# Patient Record
Sex: Female | Born: 1975
Health system: Southern US, Community
[De-identification: ages and names within clinical notes are randomized; demographics above are authoritative.]

## PROBLEM LIST (undated history)

## (undated) DIAGNOSIS — N760 Acute vaginitis: Secondary | ICD-10-CM

## (undated) DIAGNOSIS — R1031 Right lower quadrant pain: Secondary | ICD-10-CM

## (undated) DIAGNOSIS — K589 Irritable bowel syndrome without diarrhea: Secondary | ICD-10-CM

## (undated) DIAGNOSIS — N83209 Unspecified ovarian cyst, unspecified side: Secondary | ICD-10-CM

## (undated) DIAGNOSIS — N898 Other specified noninflammatory disorders of vagina: Secondary | ICD-10-CM

## (undated) DIAGNOSIS — E559 Vitamin D deficiency, unspecified: Secondary | ICD-10-CM

## (undated) DIAGNOSIS — I10 Essential (primary) hypertension: Secondary | ICD-10-CM

## (undated) DIAGNOSIS — B9689 Other specified bacterial agents as the cause of diseases classified elsewhere: Secondary | ICD-10-CM

## (undated) HISTORY — PX: WISDOM TOOTH EXTRACTION: SHX21

## (undated) HISTORY — DX: Irritable bowel syndrome, unspecified: K58.9

## (undated) HISTORY — DX: Right lower quadrant pain: R10.31

## (undated) HISTORY — DX: Other specified bacterial agents as the cause of diseases classified elsewhere: B96.89

## (undated) HISTORY — DX: Vitamin D deficiency, unspecified: E55.9

## (undated) HISTORY — DX: Essential (primary) hypertension: I10

## (undated) HISTORY — DX: Acute vaginitis: N76.0

## (undated) HISTORY — DX: Other specified noninflammatory disorders of vagina: N89.8

## (undated) HISTORY — DX: Unspecified ovarian cyst, unspecified side: N83.209

---

## 2000-06-30 ENCOUNTER — Inpatient Hospital Stay (HOSPITAL_COMMUNITY): Admission: AD | Admit: 2000-06-30 | Discharge: 2000-07-03 | Payer: Self-pay | Admitting: Obstetrics and Gynecology

## 2001-01-22 ENCOUNTER — Other Ambulatory Visit: Admission: RE | Admit: 2001-01-22 | Discharge: 2001-01-22 | Payer: Self-pay | Admitting: Obstetrics and Gynecology

## 2002-11-11 ENCOUNTER — Emergency Department (HOSPITAL_COMMUNITY): Admission: EM | Admit: 2002-11-11 | Discharge: 2002-11-11 | Payer: Self-pay | Admitting: Emergency Medicine

## 2002-11-12 ENCOUNTER — Observation Stay (HOSPITAL_COMMUNITY): Admission: AD | Admit: 2002-11-12 | Discharge: 2002-11-13 | Payer: Self-pay | Admitting: Obstetrics & Gynecology

## 2002-11-12 ENCOUNTER — Encounter: Payer: Self-pay | Admitting: Obstetrics & Gynecology

## 2006-02-05 ENCOUNTER — Ambulatory Visit: Payer: Self-pay | Admitting: Family Medicine

## 2006-02-18 ENCOUNTER — Encounter: Payer: Self-pay | Admitting: Family Medicine

## 2006-02-18 DIAGNOSIS — M545 Low back pain: Secondary | ICD-10-CM

## 2007-10-20 ENCOUNTER — Other Ambulatory Visit: Admission: RE | Admit: 2007-10-20 | Discharge: 2007-10-20 | Payer: Self-pay | Admitting: Obstetrics and Gynecology

## 2007-10-23 ENCOUNTER — Ambulatory Visit (HOSPITAL_COMMUNITY): Admission: RE | Admit: 2007-10-23 | Discharge: 2007-10-23 | Payer: Self-pay | Admitting: Obstetrics & Gynecology

## 2008-03-26 HISTORY — PX: FLEXIBLE SIGMOIDOSCOPY: SHX1649

## 2008-10-11 ENCOUNTER — Emergency Department (HOSPITAL_COMMUNITY): Admission: EM | Admit: 2008-10-11 | Discharge: 2008-10-11 | Payer: Self-pay | Admitting: Emergency Medicine

## 2009-06-13 ENCOUNTER — Encounter: Payer: Self-pay | Admitting: Gastroenterology

## 2009-06-13 ENCOUNTER — Telehealth: Payer: Self-pay | Admitting: Gastroenterology

## 2009-06-13 DIAGNOSIS — R197 Diarrhea, unspecified: Secondary | ICD-10-CM

## 2009-06-15 ENCOUNTER — Encounter: Payer: Self-pay | Admitting: Gastroenterology

## 2009-06-16 LAB — CONVERTED CEMR LAB
ALT: 13 units/L (ref 0–35)
Alkaline Phosphatase: 60 units/L (ref 39–117)
HCT: 37.8 % (ref 36.0–46.0)
Lymphs Abs: 1.5 10*3/uL (ref 0.7–4.0)
MCV: 91.6 fL (ref 78.0–100.0)
Monocytes Absolute: 0.4 10*3/uL (ref 0.1–1.0)
Neutro Abs: 3 10*3/uL (ref 1.7–7.7)
Neutrophils Relative %: 60 % (ref 43–77)
Platelets: 215 10*3/uL (ref 150–400)
RBC: 4.13 M/uL (ref 3.87–5.11)
Total Protein: 7 g/dL (ref 6.0–8.3)
WBC: 5 10*3/uL (ref 4.0–10.5)

## 2009-06-22 ENCOUNTER — Encounter: Payer: Self-pay | Admitting: Gastroenterology

## 2009-07-04 ENCOUNTER — Ambulatory Visit (HOSPITAL_COMMUNITY): Admission: RE | Admit: 2009-07-04 | Discharge: 2009-07-04 | Payer: Self-pay | Admitting: Gastroenterology

## 2009-07-04 ENCOUNTER — Ambulatory Visit: Payer: Self-pay | Admitting: Gastroenterology

## 2009-09-15 ENCOUNTER — Other Ambulatory Visit: Admission: RE | Admit: 2009-09-15 | Discharge: 2009-09-15 | Payer: Self-pay | Admitting: Obstetrics and Gynecology

## 2010-04-25 NOTE — Letter (Signed)
Summary: EGD/FLEX SIG ORDER  EGD/FLEX SIG ORDER   Imported By: Ave Filter 06/22/2009 08:59:19  _____________________________________________________________________  External Attachment:    Type:   Image     Comment:   External Document

## 2010-04-25 NOTE — Progress Notes (Signed)
Summary: DIARRHEA  spoke with pt. having diarrhea for 2-3 mos.Abd pain getting worse. has pain after eating every time. lower abd pain. no fever or chills. no rectal bleeding. bm:1/day. Decreased appetite. no weight loss. no formed stool ever. doesn't feel stressed.   needs fecal lactoferrin, cbc, hfp, cdiff toxin x3, giardia ag this week. If all negative, needs EGD/? TCS. [place] MD  June 13, 2009 4:43 PM  Appended Document: DIARRHEA Orders faxed to the lab.

## 2010-04-25 NOTE — Miscellaneous (Signed)
Summary: Orders Update  Clinical Lists Changes  Problems: Added new problem of DIARRHEA (ICD-787.91) Orders: Added new Test order of T-CBC w/Diff [phone]) - Signed Added new Test order of T-Hepatic Function [phone]) - Signed Added new Test order of T-Stool Giardia / Crypto- EIA ([id number]) - Signed Added new Test order of T-Fecal WBC ([id number]) - Signed Added new Test order of T-Culture, C-Diff Toxin A/B ([id number]) - Signed Added new Test order of T-Culture, C-Diff Toxin A/B ([id number]) - Signed Added new Test order of T-Culture, C-Diff Toxin A/B ([id number]) - Signed

## 2010-09-20 ENCOUNTER — Other Ambulatory Visit (HOSPITAL_COMMUNITY)
Admission: RE | Admit: 2010-09-20 | Discharge: 2010-09-20 | Disposition: A | Discharge status: Home / Self Care | Payer: 59 | Source: Ambulatory Visit | Attending: Obstetrics and Gynecology | Admitting: Obstetrics and Gynecology

## 2010-09-20 ENCOUNTER — Other Ambulatory Visit: Payer: Self-pay | Admitting: Adult Health

## 2010-09-20 DIAGNOSIS — Z01419 Encounter for gynecological examination (general) (routine) without abnormal findings: Secondary | ICD-10-CM | POA: Insufficient documentation

## 2011-10-01 ENCOUNTER — Other Ambulatory Visit (HOSPITAL_COMMUNITY)
Admission: RE | Admit: 2011-10-01 | Discharge: 2011-10-01 | Disposition: A | Discharge status: Home / Self Care | Payer: 59 | Source: Ambulatory Visit | Attending: Obstetrics and Gynecology | Admitting: Obstetrics and Gynecology

## 2011-10-01 ENCOUNTER — Other Ambulatory Visit: Payer: Self-pay | Admitting: Adult Health

## 2011-10-01 DIAGNOSIS — Z1159 Encounter for screening for other viral diseases: Secondary | ICD-10-CM | POA: Insufficient documentation

## 2011-10-01 DIAGNOSIS — Z01419 Encounter for gynecological examination (general) (routine) without abnormal findings: Secondary | ICD-10-CM | POA: Insufficient documentation

## 2012-09-29 ENCOUNTER — Encounter: Payer: Self-pay | Admitting: Family Medicine

## 2012-09-29 ENCOUNTER — Ambulatory Visit (INDEPENDENT_AMBULATORY_CARE_PROVIDER_SITE_OTHER): Payer: 59 | Admitting: Family Medicine

## 2012-09-29 VITALS — BP 122/88 | Temp 98.5°F | Wt 127.0 lb

## 2012-09-29 DIAGNOSIS — J069 Acute upper respiratory infection, unspecified: Secondary | ICD-10-CM

## 2012-09-29 DIAGNOSIS — J029 Acute pharyngitis, unspecified: Secondary | ICD-10-CM

## 2012-09-29 MED ORDER — AMOXICILLIN 500 MG PO TABS: 500.0000 mg | ORAL_TABLET | Freq: Two times a day (BID) | ORAL | Status: DC

## 2012-09-29 NOTE — Progress Notes (Signed)
  Subjective:    Patient ID: Jennifer Harris, female    DOB: 28-Mar-1975, 37 y.o.   MRN: [id number]  Cough This is a new problem. The current episode started yesterday. Associated symptoms include headaches and rhinorrhea. Pertinent negatives include no chest pain, ear pain, fever, shortness of breath or wheezing. Associated symptoms comments: Runny nose. Treatments tried: tylenol sinus congestion and pain, cephacol throat longenzes.   Patient does relate some pharyngeal pain discomfort when she swallows it does radiate to the ears she denies wheezing or difficulty breathing. She does relate severe amount head congestion and drainage over the past couple days   Review of Systems  Constitutional: Negative for fever and activity change.  HENT: Positive for congestion and rhinorrhea. Negative for ear pain.   Eyes: Negative for discharge.  Respiratory: Positive for cough. Negative for shortness of breath and wheezing.   Cardiovascular: Negative for chest pain.  Neurological: Positive for headaches.       Objective:   Physical Exam  Nursing note and vitals reviewed. Constitutional: She appears well-developed.  HENT:  Head: Normocephalic.  Nose: Nose normal.  Mouth/Throat: Oropharynx is clear and moist. No oropharyngeal exudate.  Neck: Neck supple.  Cardiovascular: Normal rate and normal heart sounds.   No murmur heard. Pulmonary/Chest: Effort normal and breath sounds normal. She has no wheezes.  Lymphadenopathy:    She has no cervical adenopathy.  Skin: Skin is warm and dry.          Assessment & Plan:  Viral URI/pharyngitis-rapid strep test negative. Sinuses nontender I recommend comfort measures. If not improving by the end of the week amoxicillin 500 mg 3 times a day for 10 days she will not get prescription field filled if she starts feeling better warning signs were discussed

## 2012-09-30 ENCOUNTER — Encounter: Payer: Self-pay | Admitting: *Deleted

## 2012-10-23 ENCOUNTER — Other Ambulatory Visit: Payer: 59

## 2012-10-23 ENCOUNTER — Encounter: Payer: Self-pay | Admitting: Women's Health

## 2012-10-23 ENCOUNTER — Ambulatory Visit (INDEPENDENT_AMBULATORY_CARE_PROVIDER_SITE_OTHER): Payer: 59 | Admitting: Women's Health

## 2012-10-23 ENCOUNTER — Other Ambulatory Visit (HOSPITAL_COMMUNITY)
Admission: RE | Admit: 2012-10-23 | Discharge: 2012-10-23 | Disposition: A | Discharge status: Home / Self Care | Payer: 59 | Source: Ambulatory Visit | Attending: Obstetrics and Gynecology | Admitting: Obstetrics and Gynecology

## 2012-10-23 VITALS — BP 140/80 | Ht 66.5 in | Wt 126.2 lb

## 2012-10-23 DIAGNOSIS — Z1151 Encounter for screening for human papillomavirus (HPV): Secondary | ICD-10-CM | POA: Insufficient documentation

## 2012-10-23 DIAGNOSIS — Z01419 Encounter for gynecological examination (general) (routine) without abnormal findings: Secondary | ICD-10-CM | POA: Insufficient documentation

## 2012-10-23 DIAGNOSIS — B9689 Other specified bacterial agents as the cause of diseases classified elsewhere: Secondary | ICD-10-CM

## 2012-10-23 DIAGNOSIS — N760 Acute vaginitis: Secondary | ICD-10-CM

## 2012-10-23 DIAGNOSIS — Z3009 Encounter for other general counseling and advice on contraception: Secondary | ICD-10-CM

## 2012-10-23 DIAGNOSIS — N898 Other specified noninflammatory disorders of vagina: Secondary | ICD-10-CM

## 2012-10-23 LAB — POCT WET PREP (WET MOUNT): Clue Cells Wet Prep Whiff POC: NEGATIVE

## 2012-10-23 MED ORDER — NORGESTIM-ETH ESTRAD TRIPHASIC 0.18/0.215/0.25 MG-25 MCG PO TABS: 1.0000 | ORAL_TABLET | Freq: Every day | ORAL | Status: DC

## 2012-10-23 MED ORDER — NYSTATIN-TRIAMCINOLONE 100000-0.1 UNIT/GM-% EX OINT: TOPICAL_OINTMENT | Freq: Two times a day (BID) | CUTANEOUS | Status: DC

## 2012-10-23 MED ORDER — METRONIDAZOLE 0.75 % VA GEL: 1.0000 | Freq: Every day | VAGINAL | Status: AC

## 2012-10-23 NOTE — Progress Notes (Signed)
Patient ID: Jennifer Harris, female   DOB: 1975-11-19, 37 y.o.   MRN: [id number] Subjective:     Jennifer Harris is a 37 y.o. female here for a routine well woman exam.  Patient's last menstrual period was 09/27/2012. No obstetric history on file. She is married in a mutually monogamous relationship.  She reports increased vaginal d/c x few weeks w/ vulvar redness, itching, and irritation.    Gynecologic History Patient's last menstrual period was 09/27/2012. Contraception: OCP (estrogen/progesterone) Last Pap: 2013. Results were: normal Last mammogram: never.   Obstetric History OB History   Grav Para Term Preterm Abortions TAB SAB Ect Mult Living                 Active Ambulatory Problems    Diagnosis Date Noted  . LOW BACK PAIN 02/18/2006  . DIARRHEA 06/13/2009   Resolved Ambulatory Problems    Diagnosis Date Noted  . No Resolved Ambulatory Problems   Past Medical History  Diagnosis Date  . IBS (irritable bowel syndrome)   . Vitamin D deficiency      The following portions of the patient's history were reviewed and updated as appropriate: allergies, current medications, past family history, past medical history, past social history, past surgical history and problem list.  Review of Systems  Review of Systems  Constitutional: Negative for fever, chills, weight loss, malaise/fatigue and diaphoresis.  HENT: Negative for hearing loss, ear pain, nosebleeds, congestion, sore throat, neck pain, tinnitus and ear discharge.   Eyes: Negative for blurred vision, double vision, photophobia, pain, discharge and redness.  Respiratory: Negative for cough, hemoptysis, sputum production, shortness of breath, wheezing and stridor.   Cardiovascular: Negative for chest pain, palpitations, orthopnea, claudication, leg swelling and PND.  Gastrointestinal: negative for abdominal pain. Negative for heartburn, nausea, vomiting, diarrhea, constipation, blood in stool and melena.   Genitourinary: Negative for dysuria, urgency, frequency, hematuria and flank pain.  Musculoskeletal: Negative for myalgias, back pain, joint pain and falls.  Skin: Negative for itching and rash.  Neurological: Negative for dizziness, tingling, tremors, sensory change, speech change, focal weakness, seizures, loss of consciousness, weakness and headaches.  Endo/Heme/Allergies: Negative for environmental allergies and polydipsia. Does not bruise/bleed easily.  Psychiatric/Behavioral: Negative for depression, suicidal ideas, hallucinations, memory loss and substance abuse. The patient is not nervous/anxious and does not have insomnia.        Objective:  BP 140/80  Ht 5' 6.5" (1.689 m)  Wt 126 lb 3.2 oz (57.244 kg)  BMI 20.07 kg/m2  LMP 09/27/2012  BP recheck 122/82   Physical Exam  Vitals reviewed. Constitutional: She is oriented to person, place, and time. She appears well-developed and well-nourished.  HENT:  Head: Normocephalic and atraumatic.        Right Ear: External ear normal.  Left Ear: External ear normal.  Nose: Nose normal.  Mouth/Throat: Oropharynx is clear and moist.  Eyes: Conjunctivae and EOM are normal. Pupils are equal, round, and reactive to light. Right eye exhibits no discharge. Left eye exhibits no discharge. No scleral icterus.  Neck: Normal range of motion. Neck supple. No tracheal deviation present. No thyromegaly present.  Cardiovascular: Normal rate, regular rhythm, normal heart sounds and intact distal pulses.  Exam reveals no gallop and no friction rub.   No murmur heard. Respiratory: Effort normal and breath sounds normal. No respiratory distress. She has no wheezes. She has no rales. She exhibits no tenderness.  Breasts: no dominate palpable mass, retraction or nipple discharge GI: Soft. Bowel  sounds are normal. She exhibits no distension and no mass. There is no tenderness. There is no rebound and no guarding.  Genitourinary:  Vulva is erythematous w/  fissures Vagina is erythematous w/ yellow nonodorous d/c Cervix normal in appearance and pap is done Uterus is normal size shape and contour Adnexa is negative with normal sized ovaries   Musculoskeletal: Normal range of motion. She exhibits no edema and no tenderness.  Neurological: She is alert and oriented to person, place, and time. She has normal reflexes. She displays normal reflexes. No cranial nerve deficit. She exhibits normal muscle tone. Coordination normal.  Skin: Skin is warm and dry. No rash noted. No erythema. No pallor.  Psychiatric: She has a normal mood and affect. Her behavior is normal. Judgment and thought content normal.      Wet prep: +clues, many WBCs  Assessment:    Healthy female exam.  BV Vulvovaginal irritation  Contraception management Plan:   Rx metrogel 1 applicatorful q hs x 5 nights Rx mytrex 2x/d prn vulvar irritation Refill Tri-Sprintec CBC, CMP, TSH, Lipid panel F/u 1 year for physical  Cheral Marker, CNM, WHNP-BC 10/23/2012 5:05 PM

## 2012-10-23 NOTE — Patient Instructions (Addendum)
Bacterial Vaginosis Bacterial vaginosis (BV) is a vaginal infection where the normal balance of bacteria in the vagina is disrupted. The normal balance is then replaced by an overgrowth of certain bacteria. There are several different kinds of bacteria that can cause BV. BV is the most common vaginal infection in women of childbearing age. CAUSES   The cause of BV is not fully understood. BV develops when there is an increase or imbalance of harmful bacteria.  Some activities or behaviors can upset the normal balance of bacteria in the vagina and put women at increased risk including:  Having a new sex partner or multiple sex partners.  Douching.  Using an intrauterine device (IUD) for contraception.  It is not clear what role sexual activity plays in the development of BV. However, women that have never had sexual intercourse are rarely infected with BV. Women do not get BV from toilet seats, bedding, swimming pools or from touching objects around them.  SYMPTOMS   Grey vaginal discharge.  A fish-like odor with discharge, especially after sexual intercourse.  Itching or burning of the vagina and vulva.  Burning or pain with urination.  Some women have no signs or symptoms at all. DIAGNOSIS  Your caregiver must examine the vagina for signs of BV. Your caregiver will perform lab tests and look at the sample of vaginal fluid through a microscope. They will look for bacteria and abnormal cells (clue cells), a pH test higher than 4.5, and a positive amine test all associated with BV.  RISKS AND COMPLICATIONS   Pelvic inflammatory disease (PID).  Infections following gynecology surgery.  Developing HIV.  Developing herpes virus. TREATMENT  Sometimes BV will clear up without treatment. However, all women with symptoms of BV should be treated to avoid complications, especially if gynecology surgery is planned. Female partners generally do not need to be treated. However, BV may spread  between female sex partners so treatment is helpful in preventing a recurrence of BV.   BV may be treated with antibiotics. The antibiotics come in either pill or vaginal cream forms. Either can be used with nonpregnant or pregnant women, but the recommended dosages differ. These antibiotics are not harmful to the baby.  BV can recur after treatment. If this happens, a second round of antibiotics will often be prescribed.  Treatment is important for pregnant women. If not treated, BV can cause a premature delivery, especially for a pregnant woman who had a premature birth in the past. All pregnant women who have symptoms of BV should be checked and treated.  For chronic reoccurrence of BV, treatment with a type of prescribed gel vaginally twice a week is helpful. HOME CARE INSTRUCTIONS   Finish all medication as directed by your caregiver.  Do not have sex until treatment is completed.  Tell your sexual partner that you have a vaginal infection. They should see their caregiver and be treated if they have problems, such as a mild rash or itching.  Practice safe sex. Use condoms. Only have 1 sex partner. PREVENTION  Basic prevention steps can help reduce the risk of upsetting the natural balance of bacteria in the vagina and developing BV:  Do not have sexual intercourse (be abstinent).  Do not douche.  Use all of the medicine prescribed for treatment of BV, even if the signs and symptoms go away.  Tell your sex partner if you have BV. That way, they can be treated, if needed, to prevent reoccurrence. SEEK MEDICAL CARE IF:     Your symptoms are not improving after 3 days of treatment.  You have increased discharge, pain, or fever. MAKE SURE YOU:   Understand these instructions.  Will watch your condition.  Will get help right away if you are not doing well or get worse. FOR MORE INFORMATION  Division of STD Prevention (DSTDP), Centers for Disease Control and Prevention:  www.cdc.gov/std American Social Health Association (ASHA): www.ashastd.org  Document Released: 03/12/2005 Document Revised: 06/04/2011 Document Reviewed: 09/02/2008 ExitCare Patient Information 2014 [employer].  

## 2012-10-24 LAB — LIPID PANEL
Cholesterol: 179 mg/dL (ref 0–200)
HDL: 68 mg/dL (ref >39)
LDL Cholesterol: 97 mg/dL (ref 0–99)
Total CHOL/HDL Ratio: 2.6 Ratio
Triglycerides: 71 mg/dL (ref <150)
VLDL: 14 mg/dL (ref 0–40)

## 2012-10-24 LAB — COMPREHENSIVE METABOLIC PANEL
ALT: 10 U/L (ref 0–35)
AST: 12 U/L (ref 0–37)
Albumin: 4.5 g/dL (ref 3.5–5.2)
Alkaline Phosphatase: 79 U/L (ref 39–117)
BUN: 10 mg/dL (ref 6–23)
CO2: 26 mEq/L (ref 19–32)
Calcium: 9 mg/dL (ref 8.4–10.5)
Chloride: 103 mEq/L (ref 96–112)
Creat: 0.69 mg/dL (ref 0.50–1.10)
Glucose, Bld: 91 mg/dL (ref 70–99)
Potassium: 4.4 mEq/L (ref 3.5–5.3)
Sodium: 137 mEq/L (ref 135–145)
Total Bilirubin: 0.6 mg/dL (ref 0.3–1.2)
Total Protein: 7.6 g/dL (ref 6.0–8.3)

## 2012-10-24 LAB — TSH: TSH: 1.958 u[IU]/mL (ref 0.350–4.500)

## 2012-10-24 LAB — CBC
HCT: 38.2 % (ref 36.0–46.0)
Hemoglobin: 12.7 g/dL (ref 12.0–15.0)
MCH: 29.9 pg (ref 26.0–34.0)
MCHC: 33.2 g/dL (ref 30.0–36.0)
MCV: 89.9 fL (ref 78.0–100.0)

## 2012-12-01 ENCOUNTER — Other Ambulatory Visit: Payer: 59 | Admitting: Women's Health

## 2013-05-29 ENCOUNTER — Ambulatory Visit (INDEPENDENT_AMBULATORY_CARE_PROVIDER_SITE_OTHER): Payer: 59

## 2013-05-29 ENCOUNTER — Other Ambulatory Visit: Payer: Self-pay | Admitting: Adult Health

## 2013-05-29 ENCOUNTER — Ambulatory Visit (INDEPENDENT_AMBULATORY_CARE_PROVIDER_SITE_OTHER): Payer: 59 | Admitting: Adult Health

## 2013-05-29 ENCOUNTER — Encounter: Payer: Self-pay | Admitting: Adult Health

## 2013-05-29 VITALS — BP 100/70 | Ht 66.5 in | Wt 129.0 lb

## 2013-05-29 DIAGNOSIS — N949 Unspecified condition associated with female genital organs and menstrual cycle: Secondary | ICD-10-CM

## 2013-05-29 DIAGNOSIS — N921 Excessive and frequent menstruation with irregular cycle: Secondary | ICD-10-CM | POA: Insufficient documentation

## 2013-05-29 DIAGNOSIS — R1031 Right lower quadrant pain: Secondary | ICD-10-CM

## 2013-05-29 DIAGNOSIS — Z8041 Family history of malignant neoplasm of ovary: Secondary | ICD-10-CM

## 2013-05-29 DIAGNOSIS — N83209 Unspecified ovarian cyst, unspecified side: Secondary | ICD-10-CM

## 2013-05-29 DIAGNOSIS — A499 Bacterial infection, unspecified: Secondary | ICD-10-CM

## 2013-05-29 DIAGNOSIS — B9689 Other specified bacterial agents as the cause of diseases classified elsewhere: Secondary | ICD-10-CM

## 2013-05-29 DIAGNOSIS — N760 Acute vaginitis: Secondary | ICD-10-CM

## 2013-05-29 HISTORY — DX: Other specified bacterial agents as the cause of diseases classified elsewhere: B96.89

## 2013-05-29 HISTORY — DX: Right lower quadrant pain: R10.31

## 2013-05-29 HISTORY — DX: Unspecified ovarian cyst, unspecified side: N83.209

## 2013-05-29 LAB — POCT WET PREP (WET MOUNT): WBC WET PREP: POSITIVE

## 2013-05-29 MED ORDER — TINIDAZOLE 500 MG PO TABS: ORAL_TABLET | ORAL | Status: DC

## 2013-05-29 NOTE — Progress Notes (Signed)
Subjective:     Patient ID: Jennifer Harris, female   DOB: 11/29/1975, 38 y.o.   MRN: [id number]  HPI Jennifer Harris is a 38 year old white female in complaining of BTB and tenderness RLQ.  Review of Systems See HPI Reviewed past medical,surgical, social and family history. Reviewed medications and allergies.     Objective:   Physical Exam BP 100/70  Ht 5' 6.5" (1.689 m)  Wt 129 lb (58.514 kg)  BMI 20.51 kg/m2  LMP 05/13/2013   Skin warm and dry.Pelvic: external genitalia is normal in appearance, but has irritated left labia, vagina: pinkish discharge with odor, cervix:smooth and bulbous, uterus: normal size, shape and contour, non tender, no masses felt, adnexa: no masses, has RLQ tenderness noted. Wet prep: + for clue cells and +WBCs and RBCs.  Assessment:     Irregular intermenstrual bleeding RLQ pain Right ovarian cyst BV    Plan:    Take tinidazole 500 mg 4 po now and i 4 in am, # 8 given, lot [id number] B exp. 09/17/14 Return in 6 weeks for US and see me Review handout on BV and ovarian cyst Call prn   Continue OCs

## 2013-05-29 NOTE — Patient Instructions (Signed)
Bacterial Vaginosis Bacterial vaginosis is a vaginal infection that occurs when the normal balance of bacteria in the vagina is disrupted. It results from an overgrowth of certain bacteria. This is the most common vaginal infection in women of childbearing age. Treatment is important to prevent complications, especially in pregnant women, as it can cause a premature delivery. CAUSES  Bacterial vaginosis is caused by an increase in harmful bacteria that are normally present in smaller amounts in the vagina. Several different kinds of bacteria can cause bacterial vaginosis. However, the reason that the condition develops is not fully understood. RISK FACTORS Certain activities or behaviors can put you at an increased risk of developing bacterial vaginosis, including:  Having a new sex partner or multiple sex partners.  Douching.  Using an intrauterine device (IUD) for contraception. Women do not get bacterial vaginosis from toilet seats, bedding, swimming pools, or contact with objects around them. SIGNS AND SYMPTOMS  Some women with bacterial vaginosis have no signs or symptoms. Common symptoms include:  Grey vaginal discharge.  A fishlike odor with discharge, especially after sexual intercourse.  Itching or burning of the vagina and vulva.  Burning or pain with urination. DIAGNOSIS  Your health care provider will take a medical history and examine the vagina for signs of bacterial vaginosis. A sample of vaginal fluid may be taken. Your health care provider will look at this sample under a microscope to check for bacteria and abnormal cells. A vaginal pH test may also be done.  TREATMENT  Bacterial vaginosis may be treated with antibiotic medicines. These may be given in the form of a pill or a vaginal cream. A second round of antibiotics may be prescribed if the condition comes back after treatment.  HOME CARE INSTRUCTIONS   Only take over-the-counter or prescription medicines as  directed by your health care provider.  If antibiotic medicine was prescribed, take it as directed. Make sure you finish it even if you start to feel better.  Do not have sex until treatment is completed.  Tell all sexual partners that you have a vaginal infection. They should see their health care provider and be treated if they have problems, such as a mild rash or itching.  Practice safe sex by using condoms and only having one sex partner. SEEK MEDICAL CARE IF:   Your symptoms are not improving after 3 days of treatment.  You have increased discharge or pain.  You have a fever. MAKE SURE YOU:   Understand these instructions.  Will watch your condition.  Will get help right away if you are not doing well or get worse. FOR MORE INFORMATION  Centers for Disease Control and Prevention, Division of STD Prevention: www.cdc.gov/std American Sexual Health Association (ASHA): www.ashastd.org  Document Released: 03/12/2005 Document Revised: 12/31/2012 Document Reviewed: 10/22/2012 ExitCare Patient Information 2014 [employer]. Ovarian Cyst An ovarian cyst is a fluid-filled sac that forms on an ovary. The ovaries are small organs that produce eggs in women. Various types of cysts can form on the ovaries. Most are not cancerous. Many do not cause problems, and they often go away on their own. Some may cause symptoms and require treatment. Common types of ovarian cysts include:  Functional cysts These cysts may occur every month during the menstrual cycle. This is normal. The cysts usually go away with the next menstrual cycle if the woman does not get pregnant. Usually, there are no symptoms with a functional cyst.  Endometrioma cysts These cysts form from the tissue   that lines the uterus. They are also called "chocolate cysts" because they become filled with blood that turns brown. This type of cyst can cause pain in the lower abdomen during intercourse and with your menstrual  period.  Cystadenoma cysts This type develops from the cells on the outside of the ovary. These cysts can get very big and cause lower abdomen pain and pain with intercourse. This type of cyst can twist on itself, cut off its blood supply, and cause severe pain. It can also easily rupture and cause a lot of pain.  Dermoid cysts This type of cyst is sometimes found in both ovaries. These cysts may contain different kinds of body tissue, such as skin, teeth, hair, or cartilage. They usually do not cause symptoms unless they get very big.  Theca lutein cysts These cysts occur when too much of a certain hormone (human chorionic gonadotropin) is produced and overstimulates the ovaries to produce an egg. This is most common after procedures used to assist with the conception of a baby (in vitro fertilization). CAUSES   Fertility drugs can cause a condition in which multiple large cysts are formed on the ovaries. This is called ovarian hyperstimulation syndrome.  A condition called polycystic ovary syndrome can cause hormonal imbalances that can lead to nonfunctional ovarian cysts. SIGNS AND SYMPTOMS  Many ovarian cysts do not cause symptoms. If symptoms are present, they may include:  Pelvic pain or pressure.  Pain in the lower abdomen.  Pain during sexual intercourse.  Increasing girth (swelling) of the abdomen.  Abnormal menstrual periods.  Increasing pain with menstrual periods.  Stopping having menstrual periods without being pregnant. DIAGNOSIS  These cysts are commonly found during a routine or annual pelvic exam. Tests may be ordered to find out more about the cyst. These tests may include:  Ultrasound.  X-ray of the pelvis.  CT scan.  MRI.  Blood tests. TREATMENT  Many ovarian cysts go away on their own without treatment. Your health care provider may want to check your cyst regularly for 2 3 months to see if it changes. For women in menopause, it is particularly important  to monitor a cyst closely because of the higher rate of ovarian cancer in menopausal women. When treatment is needed, it may include any of the following:  A procedure to drain the cyst (aspiration). This may be done using a long needle and ultrasound. It can also be done through a laparoscopic procedure. This involves using a thin, lighted tube with a tiny camera on the end (laparoscope) inserted through a small incision.  Surgery to remove the whole cyst. This may be done using laparoscopic surgery or an open surgery involving a larger incision in the lower abdomen.  Hormone treatment or birth control pills. These methods are sometimes used to help dissolve a cyst. HOME CARE INSTRUCTIONS   Only take over-the-counter or prescription medicines as directed by your health care provider.  Follow up with your health care provider as directed.  Get regular pelvic exams and Pap tests. SEEK MEDICAL CARE IF:   Your periods are late, irregular, or painful, or they stop.  Your pelvic pain or abdominal pain does not go away.  Your abdomen becomes larger or swollen.  You have pressure on your bladder or trouble emptying your bladder completely.  You have pain during sexual intercourse.  You have feelings of fullness, pressure, or discomfort in your stomach.  You lose weight for no apparent reason.  You feel generally ill.    You become constipated.  You lose your appetite.  You develop acne.  You have an increase in body and facial hair.  You are gaining weight, without changing your exercise and eating habits.  You think you are pregnant. SEEK IMMEDIATE MEDICAL CARE IF:   You have increasing abdominal pain.  You feel sick to your stomach (nauseous), and you throw up (vomit).  You develop a fever that comes on suddenly.  You have abdominal pain during a bowel movement.  Your menstrual periods become heavier than usual. Document Released: 03/12/2005 Document Revised: 12/31/2012  Document Reviewed: 11/17/2012 [facility] InstituteExitCare Patient Information 2014 [place]ExitCare, [place]. Take 4 now and 4 in am tinidazole Follow up in 6 weeks for [place]

## 2013-07-02 ENCOUNTER — Other Ambulatory Visit: Payer: Self-pay | Admitting: Adult Health

## 2013-07-02 DIAGNOSIS — N83209 Unspecified ovarian cyst, unspecified side: Secondary | ICD-10-CM

## 2013-07-06 ENCOUNTER — Ambulatory Visit (INDEPENDENT_AMBULATORY_CARE_PROVIDER_SITE_OTHER): Payer: 59 | Admitting: Adult Health

## 2013-07-06 ENCOUNTER — Ambulatory Visit (INDEPENDENT_AMBULATORY_CARE_PROVIDER_SITE_OTHER): Payer: 59

## 2013-07-06 ENCOUNTER — Encounter: Payer: Self-pay | Admitting: Adult Health

## 2013-07-06 VITALS — BP 110/62 | Ht 66.0 in | Wt 128.0 lb

## 2013-07-06 DIAGNOSIS — N760 Acute vaginitis: Secondary | ICD-10-CM

## 2013-07-06 DIAGNOSIS — A499 Bacterial infection, unspecified: Secondary | ICD-10-CM

## 2013-07-06 DIAGNOSIS — N83209 Unspecified ovarian cyst, unspecified side: Secondary | ICD-10-CM

## 2013-07-06 DIAGNOSIS — N898 Other specified noninflammatory disorders of vagina: Secondary | ICD-10-CM

## 2013-07-06 DIAGNOSIS — B9689 Other specified bacterial agents as the cause of diseases classified elsewhere: Secondary | ICD-10-CM

## 2013-07-06 HISTORY — DX: Other specified noninflammatory disorders of vagina: N89.8

## 2013-07-06 LAB — POCT WET PREP (WET MOUNT): WBC WET PREP: POSITIVE

## 2013-07-06 MED ORDER — METRONIDAZOLE 0.75 % VA GEL: 1.0000 | Freq: Two times a day (BID) | VAGINAL | Status: DC

## 2013-07-06 NOTE — Patient Instructions (Signed)
Try metrogel Follow up prn

## 2013-07-06 NOTE — Progress Notes (Signed)
Subjective:     Patient ID: Jennifer Harris, female   DOB: 07/09/1975, 38 y.o.   MRN: [id number]  HPI Jennifer Harris is back for US,to assess ovarian cyst and complains of discharge and irritation.Resolved after tindamax.  Review of Systems See HPI Reviewed past medical,surgical, social and family history. Reviewed medications and allergies.     Objective:   Physical Exam BP 110/62  Ht 5\' 6"  (1.676 m)  Wt 128 lb (58.06 kg)  BMI 20.67 kg/m2  LMP 06/10/2013   Skin warm and dry.Pelvic: external genitalia is normal in appearance, vagina: white discharge with odor, red side walls, cervix:smooth and bulbous, uterus: normal size, shape and contour, non tender, no masses felt, adnexa: no masses or tenderness noted. Wet prep: + for clue cells and +WBCs.Uterus Anteverted uterus, size/shape appears WNL and unchanged since previous u/s  Endometrium appears symmetrical,  Right ovary 1.9 x 1.2 x 1.0 cm, cyst resolved  Left ovary 2.4 x 1.6 x 1.1 cm,  No free fluid or adnexal masses noted within pelvis  Technician Comments:  Anteverted uterus, bilateral adnexa/ovaries appear WNL   Assessment:     Resolved ovarian cyst Vaginal discharge BV    Plan:     Rx metrogel, use bid in vagina, with 1 refill Use bar soap Follow up prn

## 2013-07-10 ENCOUNTER — Ambulatory Visit: Payer: 59 | Admitting: Adult Health

## 2013-07-10 ENCOUNTER — Other Ambulatory Visit: Payer: 59

## 2013-07-21 ENCOUNTER — Telehealth: Payer: Self-pay | Admitting: Gastroenterology

## 2013-07-21 NOTE — Telephone Encounter (Signed)
Angie called today asking if SF could recommend something else that is OTC since her probiotic has been discontinued. Please advise and call her at her work # [phone]

## 2013-07-21 NOTE — Telephone Encounter (Signed)
I called and talked with her and told her about the [facility] brand. She is going to try that and if that does not work she will let us know.

## 2013-09-28 ENCOUNTER — Other Ambulatory Visit: Payer: Self-pay | Admitting: Adult Health

## 2013-09-28 ENCOUNTER — Other Ambulatory Visit: Payer: Self-pay | Admitting: Women's Health

## 2013-09-28 ENCOUNTER — Telehealth: Payer: Self-pay | Admitting: Obstetrics and Gynecology

## 2013-09-28 MED ORDER — NORGESTIM-ETH ESTRAD TRIPHASIC 0.18/0.215/0.25 MG-25 MCG PO TABS: 1.0000 | ORAL_TABLET | Freq: Every day | ORAL | Status: DC

## 2013-09-28 NOTE — Telephone Encounter (Signed)
Refilled OCs 

## 2013-10-26 ENCOUNTER — Other Ambulatory Visit: Payer: 59 | Admitting: Adult Health

## 2013-10-27 ENCOUNTER — Encounter: Payer: Self-pay | Admitting: Adult Health

## 2013-10-27 ENCOUNTER — Ambulatory Visit (INDEPENDENT_AMBULATORY_CARE_PROVIDER_SITE_OTHER): Payer: 59 | Admitting: Adult Health

## 2013-10-27 ENCOUNTER — Other Ambulatory Visit (HOSPITAL_COMMUNITY)
Admission: RE | Admit: 2013-10-27 | Discharge: 2013-10-27 | Disposition: A | Discharge status: Home / Self Care | Payer: 59 | Source: Ambulatory Visit | Attending: Adult Health | Admitting: Adult Health

## 2013-10-27 VITALS — BP 110/56 | HR 72 | Ht 66.0 in | Wt 127.0 lb

## 2013-10-27 DIAGNOSIS — Z01419 Encounter for gynecological examination (general) (routine) without abnormal findings: Secondary | ICD-10-CM

## 2013-10-27 DIAGNOSIS — Z1151 Encounter for screening for human papillomavirus (HPV): Secondary | ICD-10-CM | POA: Insufficient documentation

## 2013-10-27 DIAGNOSIS — N898 Other specified noninflammatory disorders of vagina: Secondary | ICD-10-CM

## 2013-10-27 DIAGNOSIS — Z3041 Encounter for surveillance of contraceptive pills: Secondary | ICD-10-CM

## 2013-10-27 MED ORDER — METRONIDAZOLE 0.75 % VA GEL: VAGINAL | Status: DC

## 2013-10-27 MED ORDER — NORGESTIM-ETH ESTRAD TRIPHASIC 0.18/0.215/0.25 MG-25 MCG PO TABS: 1.0000 | ORAL_TABLET | Freq: Every day | ORAL | Status: DC

## 2013-10-27 NOTE — Patient Instructions (Signed)
Continue OCs  Mammogram at 40 Try metrogel 1 applicator at HS x 5 nights then once a week Physical in 1 year

## 2013-10-27 NOTE — Progress Notes (Signed)
Patient ID: Jennifer Harris, female   DOB: 04/29/1975, 38 y.o.   MRN: [id number] History of Present Illness: Jennifer Harris is a 38 year old white female, married, in for a pap and physical.   Current Medications, Allergies, Past Medical History, Past Surgical History, Family History and Social History were reviewed in [facility] electronic medical record.     Review of Systems: Patient denies any headaches, blurred vision, shortness of breath, chest pain, abdominal pain, problems with bowel movements, urination, or intercourse. No joint pain has area right ankle where fell last year still discolored no pain, no mood swings,does have vaginal irritation on and off has had BV.She is happy with her OCs.    Physical Exam:BP 110/56  Pulse 72  Ht 5\' 6"  (1.676 m)  Wt 127 lb (57.607 kg)  BMI 20.51 kg/m2  LMP 10/02/2013 General:  Well developed, well nourished, no acute distress Skin:  Warm and dry Neck:  Midline trachea, normal thyroid Lungs; Clear to auscultation bilaterally Breast:  No dominant palpable mass, retraction, or nipple discharge Cardiovascular: Regular rate and rhythm Abdomen:  Soft, non tender, no hepatosplenomegaly Pelvic:  External genitalia is irritated on labia.  The vagina has red side walls and scant white discharge no odor. The cervix is bulbous, Pap with HPV performed.  Uterus is felt to be normal size, shape, and contour.  No adnexal masses or tenderness noted. Extremities:  No swelling noted, has some spider veins Psych:  No mood changes, alert and cooperative, seems happy, son is [id number] and husband who is [id number] has had MI recently.   Impression: Yearly gyn exam Contraceptive management Vaginal discharge and iritation    Plan: Physical in 1 year Mammogram yearly Rx Metrogel 1 applicator at hs x 5 nights in vagina then once a week with 3 refills Refilled OCs x 1 year Declines labs at present

## 2013-10-29 LAB — CYTOLOGY - PAP

## 2014-01-25 ENCOUNTER — Encounter: Payer: Self-pay | Admitting: Adult Health

## 2014-02-08 ENCOUNTER — Other Ambulatory Visit: Payer: 59

## 2014-02-08 DIAGNOSIS — Z0189 Encounter for other specified special examinations: Secondary | ICD-10-CM

## 2014-02-08 LAB — LIPID PANEL
CHOLESTEROL: 156 mg/dL (ref 0–200)
HDL: 62 mg/dL (ref >39)
LDL Cholesterol: 80 mg/dL (ref 0–99)
TRIGLYCERIDES: 70 mg/dL (ref <150)
Total CHOL/HDL Ratio: 2.5 Ratio
VLDL: 14 mg/dL (ref 0–40)

## 2014-02-08 LAB — CBC
HCT: 36 % (ref 36.0–46.0)
HEMOGLOBIN: 12.1 g/dL (ref 12.0–15.0)
MCH: 30.4 pg (ref 26.0–34.0)
MCHC: 33.6 g/dL (ref 30.0–36.0)
MCV: 90.5 fL (ref 78.0–100.0)
MPV: 9.7 fL (ref 9.4–12.4)
Platelets: 265 10*3/uL (ref 150–400)
RBC: 3.98 MIL/uL (ref 3.87–5.11)
RDW: 12.9 % (ref 11.5–15.5)
WBC: 7.2 10*3/uL (ref 4.0–10.5)

## 2014-02-08 LAB — COMPREHENSIVE METABOLIC PANEL
ALBUMIN: 3.9 g/dL (ref 3.5–5.2)
ALT: 10 U/L (ref 0–35)
AST: 12 U/L (ref 0–37)
Alkaline Phosphatase: 82 U/L (ref 39–117)
BUN: 12 mg/dL (ref 6–23)
CALCIUM: 8.8 mg/dL (ref 8.4–10.5)
CHLORIDE: 106 meq/L (ref 96–112)
CO2: 23 meq/L (ref 19–32)
Creat: 0.67 mg/dL (ref 0.50–1.10)
GLUCOSE: 81 mg/dL (ref 70–99)
POTASSIUM: 4 meq/L (ref 3.5–5.3)
Sodium: 137 mEq/L (ref 135–145)
Total Bilirubin: 0.5 mg/dL (ref 0.2–1.2)
Total Protein: 7.3 g/dL (ref 6.0–8.3)

## 2014-02-08 LAB — TSH: TSH: 1.97 u[IU]/mL (ref 0.350–4.500)

## 2014-03-05 ENCOUNTER — Ambulatory Visit: Payer: 59 | Admitting: Nurse Practitioner

## 2014-03-29 ENCOUNTER — Other Ambulatory Visit: Payer: Self-pay | Admitting: Adult Health

## 2014-09-01 ENCOUNTER — Encounter: Payer: Self-pay | Admitting: Nurse Practitioner

## 2014-09-01 ENCOUNTER — Ambulatory Visit (INDEPENDENT_AMBULATORY_CARE_PROVIDER_SITE_OTHER): Payer: 59 | Admitting: Nurse Practitioner

## 2014-09-01 VITALS — BP 126/88 | Temp 97.9°F | Ht 66.0 in | Wt 135.4 lb

## 2014-09-01 DIAGNOSIS — J0120 Acute ethmoidal sinusitis, unspecified: Secondary | ICD-10-CM | POA: Diagnosis not present

## 2014-09-01 MED ORDER — AMOXICILLIN-POT CLAVULANATE 875-125 MG PO TABS: 1.0000 | ORAL_TABLET | Freq: Two times a day (BID) | ORAL | Status: DC

## 2014-09-01 MED ORDER — METHYLPREDNISOLONE ACETATE 40 MG/ML IJ SUSP
40.0000 mg | Freq: Once | INTRAMUSCULAR | Status: AC
  Administered 2014-09-01: 40 mg via INTRAMUSCULAR

## 2014-09-01 NOTE — Progress Notes (Signed)
Subjective:  Presents complaints of sinus pressure for the past 1-2 weeks. No relief with multiple OTC meds. No fever. No sore throat. Ethmoid area headache worse on the left side. Head congestion. Slight cough. No wheezing. No ear pain. Increased clear tearing from the left eye today.  Objective:   BP 126/88 mmHg  Temp(Src) 97.9 F (36.6 C) (Oral)  Ht 5\' 6"  (1.676 m)  Wt 135 lb 6 oz (61.406 kg)  BMI 21.86 kg/m2  LMP 08/03/2014 NAD. Alert, oriented. TMs clear effusion, no erythema. Pharynx non- erythematous with PND noted. Neck supple with mild soft anterior adenopathy. Lungs clear. Heart regular rate rhythm.  Assessment: Acute ethmoidal sinusitis, recurrence not specified - Plan: methylPREDNISolone acetate (DEPO-MEDROL) injection 40 mg  Plan:  Meds ordered this encounter  Medications  . amoxicillin-clavulanate (AUGMENTIN) 875-125 MG per tablet    Sig: Take 1 tablet by mouth 2 (two) times daily.    Dispense:  20 tablet    Refill:  0    Order Specific Question:  Supervising Provider    Answer:  Merlyn AlbertLUKING, WILLIAM S [[id number]]  . methylPREDNISolone acetate (DEPO-MEDROL) injection 40 mg    Sig:    Start Nasacort AQ as directed. Call back next week if no improvement, sooner if worse.

## 2014-09-01 NOTE — Patient Instructions (Signed)
nasacort AQ as directed 

## 2014-09-02 ENCOUNTER — Other Ambulatory Visit: Payer: Self-pay | Admitting: *Deleted

## 2014-09-02 MED ORDER — NORGESTIM-ETH ESTRAD TRIPHASIC 0.18/0.215/0.25 MG-25 MCG PO TABS: 1.0000 | ORAL_TABLET | Freq: Every day | ORAL | Status: DC

## 2014-10-26 ENCOUNTER — Telehealth: Payer: Self-pay | Admitting: Family Medicine

## 2014-10-26 ENCOUNTER — Other Ambulatory Visit: Payer: Self-pay | Admitting: Nurse Practitioner

## 2014-10-26 MED ORDER — AMOXICILLIN-POT CLAVULANATE 875-125 MG PO TABS: 1.0000 | ORAL_TABLET | Freq: Two times a day (BID) | ORAL | Status: DC

## 2014-10-26 NOTE — Telephone Encounter (Signed)
Pt still having nasal pressure, its re ocuring at this point from her last  Visit here on 6/8. She saw carolyn at that time an was told to call  Back if it re occurs    [facility]

## 2014-10-26 NOTE — Telephone Encounter (Signed)
Will send in refill of Augmentin. Office visit if no better.

## 2014-10-26 NOTE — Telephone Encounter (Signed)
Med sent to pharm. Pt notified.  

## 2014-10-26 NOTE — Telephone Encounter (Signed)
Seen in June  Finished augmentin symptoms went away. Same symptoms now started 2 days ago. Sinus pressure, headache, some ear discomfort, no fever, no wheezing. Bloody nasal drainage. Pt states augmentin worked well for her. Can she get refill or does she need ov

## 2014-11-10 ENCOUNTER — Ambulatory Visit (INDEPENDENT_AMBULATORY_CARE_PROVIDER_SITE_OTHER): Payer: 59 | Admitting: Adult Health

## 2014-11-10 ENCOUNTER — Encounter: Payer: Self-pay | Admitting: Adult Health

## 2014-11-10 VITALS — BP 124/72 | HR 68 | Ht 65.0 in | Wt 134.5 lb

## 2014-11-10 DIAGNOSIS — Z01419 Encounter for gynecological examination (general) (routine) without abnormal findings: Secondary | ICD-10-CM | POA: Diagnosis not present

## 2014-11-10 DIAGNOSIS — Z3041 Encounter for surveillance of contraceptive pills: Secondary | ICD-10-CM

## 2014-11-10 NOTE — Patient Instructions (Signed)
mammogram at 40 Physical in 1 year

## 2014-11-10 NOTE — Progress Notes (Signed)
Patient ID: JULIETA ROGALSKI, female   DOB: 10-30-1975, 39 y.o.   MRN: [id number] History of Present Illness: Tiffini is a 39 year old white female, married in for a well woman gyn exam.She had a normal pap with negative HPV 10/27/13.   Current Medications, Allergies, Past Medical History, Past Surgical History, Family History and Social History were reviewed in [employer] record.     Review of Systems: Patient denies any headaches, hearing loss, fatigue, blurred vision, shortness of breath, chest pain, abdominal pain, problems with bowel movements, urination, or intercourse. No joint pain or mood swings.    Physical Exam:BP 124/72 mmHg  Pulse 68  Ht  (1.651 m)  Wt 134 lb 8 oz (61.009 kg)  BMI 22.38 kg/m2  LMP 10/27/2014 General:  Well developed, well nourished, no acute distress Skin:  Warm and dry Neck:  Midline trachea, normal thyroid, good ROM, no lymphadenopathy Lungs; Clear to auscultation bilaterally Breast:  No dominant palpable mass, retraction, or nipple discharge Cardiovascular: Regular rate and rhythm Abdomen:  Soft, non tender, no hepatosplenomegaly Pelvic:  External genitalia is normal in appearance, no lesions.  The vagina is normal in appearance. Urethra has no lesions or masses. The cervix is bulbous.  Uterus is felt to be normal size, shape, and contour.  No adnexal masses or tenderness noted.Bladder is non tender, no masses felt. Extremities/musculoskeletal:  No swelling or varicosities noted, no clubbing or cyanosis Psych:  No mood changes, alert and cooperative,seems happy   Impression: Well woman gyn exam no pap Contraceptive management    Plan: Continue OCs, has refills Physical in 1 year Mammogram at 30

## 2014-11-24 ENCOUNTER — Other Ambulatory Visit: Payer: Self-pay | Admitting: Adult Health

## 2015-05-10 MED FILL — TRI-PREVIFEM TABLET: 0.18/0.215/ | 84 days supply | Qty: 84 | Fill #2

## 2015-06-08 ENCOUNTER — Encounter: Payer: Self-pay | Admitting: Family Medicine

## 2015-06-08 ENCOUNTER — Ambulatory Visit (INDEPENDENT_AMBULATORY_CARE_PROVIDER_SITE_OTHER): Payer: 59 | Admitting: Family Medicine

## 2015-06-08 VITALS — BP 128/76 | Temp 99.0°F | Ht 66.0 in | Wt 133.2 lb

## 2015-06-08 DIAGNOSIS — J019 Acute sinusitis, unspecified: Secondary | ICD-10-CM | POA: Diagnosis not present

## 2015-06-08 MED ORDER — AMOXICILLIN-POT CLAVULANATE 875-125 MG PO TABS
1.0000 | ORAL_TABLET | Freq: Two times a day (BID) | ORAL | Status: DC
Start: 2015-06-08 — End: 2015-11-11

## 2015-06-08 NOTE — Progress Notes (Signed)
   Subjective:    Patient ID: Jennifer Harris, female    DOB: 10/31/1975, 40 y.o.   MRN: [id number]  URI  This is a new problem. The current episode started in the past 7 days. Associated symptoms include congestion, ear pain, rhinorrhea and sinus pain. Associated symptoms comments: Hoareness. Treatments tried: Mucinex, Alka Seltzer Cold.   Patient states no other concerns this visit. Patient started last week with head congestion drainage over the weekend sinus pressure pain discomfort related to significant discomfort in the sinuses. Denies high chills sweats denies muscle aches. Review of Systems  HENT: Positive for congestion, ear pain and rhinorrhea.        Objective:   Physical Exam  Moderate sinus tenderness in maxillary and frontal sinuses eardrums are normal respiratory rate normal      Assessment & Plan:  Viral syndrome Secondary rhinosinusitis Patient was seen today for upper respiratory illness. It is felt that the patient is dealing with sinusitis. Antibiotics were prescribed today. Importance of compliance with medication was discussed. Symptoms should gradually resolve over the course of the next several days. If high fevers, progressive illness, difficulty breathing, worsening condition or failure for symptoms to improve over the next several days then the patient is to follow-up. If any emergent conditions the patient is to follow-up in the emergency department otherwise to follow-up in the office.

## 2015-08-04 MED FILL — TRI-PREVIFEM TABLET: 0.18/0.215/ | 84 days supply | Qty: 84 | Fill #3

## 2015-10-25 MED FILL — TRI-PREVIFEM TABLET: 0.18/0.215/ | 84 days supply | Qty: 84 | Fill #4

## 2015-11-11 ENCOUNTER — Encounter: Payer: Self-pay | Admitting: Adult Health

## 2015-11-11 ENCOUNTER — Ambulatory Visit (INDEPENDENT_AMBULATORY_CARE_PROVIDER_SITE_OTHER): Payer: 59 | Admitting: Adult Health

## 2015-11-11 VITALS — BP 110/64 | HR 60 | Ht 66.0 in | Wt 122.0 lb

## 2015-11-11 DIAGNOSIS — Z01419 Encounter for gynecological examination (general) (routine) without abnormal findings: Secondary | ICD-10-CM

## 2015-11-11 DIAGNOSIS — Z3041 Encounter for surveillance of contraceptive pills: Secondary | ICD-10-CM

## 2015-11-11 DIAGNOSIS — Z1212 Encounter for screening for malignant neoplasm of rectum: Secondary | ICD-10-CM

## 2015-11-11 LAB — HEMOCCULT GUIAC POC 1CARD (OFFICE): FECAL OCCULT BLD: NEGATIVE

## 2015-11-11 MED ORDER — NORGESTIM-ETH ESTRAD TRIPHASIC 0.18/0.215/0.25 MG-35 MCG PO TABS: 1.0000 | ORAL_TABLET | Freq: Every day | ORAL | 99 refills | Status: DC

## 2015-11-11 NOTE — Patient Instructions (Addendum)
Physical in 1 year, pap in 2018 Mammogram now and yearly  Fating labs next year

## 2015-11-11 NOTE — Progress Notes (Signed)
Patient ID: Jennifer Harris, female   DOB: 09/27/1975, 40 y.o.   MRN: [id number] History of Present Illness: Jennifer Harris is a 40 year old white female, married, G1P1 in for a well woman gyn exam, she had a normal pap with negative HPV 10/27/13.   Current Medications, Allergies, Past Medical History, Past Surgical History, Family History and Social History were reviewed in [facility] electronic medical record.     Review of Systems: Patient denies any headaches, hearing loss, fatigue, blurred vision, shortness of breath, chest pain, abdominal pain, problems with bowel movements, urination, or intercourse. No joint pain or mood swings.She is happy with her pills.She has lost some weight on purpose this year.     Physical Exam:BP 110/64 (BP Location: Left Arm, Patient Position: Sitting, Cuff Size: Normal)   Pulse 60   Ht 5\' 6"  (1.676 m)   Wt 122 lb (55.3 kg)   LMP 10/26/2015 (Approximate)   BMI 19.69 kg/m  General:  Well developed, well nourished, no acute distress Skin:  Warm and dry Neck:  Midline trachea, normal thyroid, good ROM, no lymphadenopathy Lungs; Clear to auscultation bilaterally Breast:  No dominant palpable mass, retraction, or nipple discharge Cardiovascular: Regular rate and rhythm Abdomen:  Soft, non tender, no hepatosplenomegaly Pelvic:  External genitalia is normal in appearance, no lesions.  The vagina is normal in appearance. Urethra has no lesions or masses. The cervix is bulbous.  Uterus is felt to be normal size, shape, and contour.  No adnexal masses or tenderness noted.Bladder is non tender, no masses felt. Rectal: Good sphincter tone, no polyps, or hemorrhoids felt.  Hemoccult negative. Extremities/musculoskeletal:  No swelling or varicosities noted, no clubbing or cyanosis Psych:  No mood changes, alert and cooperative,seems happy   Impression: Well woman gyn exam no pap Contraceptive management    Plan: Refilled tri sprintec x 1 year Pap and physical  in 1 year Mammogram now and yearly Fasting labs next year

## 2015-11-14 ENCOUNTER — Other Ambulatory Visit: Payer: Self-pay | Admitting: Adult Health

## 2015-11-14 DIAGNOSIS — Z139 Encounter for screening, unspecified: Secondary | ICD-10-CM

## 2015-11-17 ENCOUNTER — Ambulatory Visit (HOSPITAL_COMMUNITY)
Admission: RE | Admit: 2015-11-17 | Discharge: 2015-11-17 | Disposition: A | Discharge status: Home / Self Care | Payer: 59 | Source: Ambulatory Visit | Attending: Adult Health | Admitting: Adult Health

## 2015-11-17 ENCOUNTER — Other Ambulatory Visit: Payer: Self-pay | Admitting: Adult Health

## 2015-11-17 DIAGNOSIS — Z1231 Encounter for screening mammogram for malignant neoplasm of breast: Secondary | ICD-10-CM | POA: Insufficient documentation

## 2015-12-04 DIAGNOSIS — R399 Unspecified symptoms and signs involving the genitourinary system: Secondary | ICD-10-CM | POA: Diagnosis not present

## 2015-12-04 DIAGNOSIS — N23 Unspecified renal colic: Secondary | ICD-10-CM | POA: Diagnosis not present

## 2015-12-05 MED FILL — HYDROCODON-APAP 5-325: 5-325 | 5 days supply | Qty: 30 | Fill #0

## 2015-12-05 MED FILL — ONDANSETRON ODT 4 MG TABLET: 4 | 7 days supply | Qty: 28 | Fill #0

## 2015-12-09 DIAGNOSIS — H524 Presbyopia: Secondary | ICD-10-CM | POA: Diagnosis not present

## 2016-01-17 MED FILL — TRI-PREVIFEM TABLET: 0.18/0.215/ | 84 days supply | Qty: 84 | Fill #0

## 2016-04-09 MED FILL — TRI-PREVIFEM TABLET: 0.18/0.215/ | 84 days supply | Qty: 84 | Fill #1

## 2016-07-05 MED FILL — TRI-PREVIFEM TABLET: 0.18/0.215/ | 84 days supply | Qty: 84 | Fill #2

## 2016-09-27 MED FILL — TRI-PREVIFEM TABLET: 0.18/0.215/ | 84 days supply | Qty: 84 | Fill #3

## 2016-11-02 ENCOUNTER — Other Ambulatory Visit: Payer: Self-pay | Admitting: Adult Health

## 2016-11-02 DIAGNOSIS — Z1239 Encounter for other screening for malignant neoplasm of breast: Secondary | ICD-10-CM

## 2016-11-27 ENCOUNTER — Other Ambulatory Visit: Payer: 59 | Admitting: Adult Health

## 2016-11-28 ENCOUNTER — Ambulatory Visit (HOSPITAL_COMMUNITY): Payer: 59

## 2016-12-05 ENCOUNTER — Ambulatory Visit (HOSPITAL_COMMUNITY)
Admission: RE | Admit: 2016-12-05 | Discharge: 2016-12-05 | Disposition: A | Discharge status: Home / Self Care | Payer: 59 | Source: Ambulatory Visit | Attending: Adult Health | Admitting: Adult Health

## 2016-12-05 DIAGNOSIS — Z1231 Encounter for screening mammogram for malignant neoplasm of breast: Secondary | ICD-10-CM | POA: Insufficient documentation

## 2016-12-05 DIAGNOSIS — Z1239 Encounter for other screening for malignant neoplasm of breast: Secondary | ICD-10-CM

## 2016-12-06 ENCOUNTER — Encounter: Payer: Self-pay | Admitting: Adult Health

## 2016-12-06 ENCOUNTER — Ambulatory Visit (INDEPENDENT_AMBULATORY_CARE_PROVIDER_SITE_OTHER): Payer: 59 | Admitting: Adult Health

## 2016-12-06 ENCOUNTER — Other Ambulatory Visit (HOSPITAL_COMMUNITY)
Admission: RE | Admit: 2016-12-06 | Discharge: 2016-12-06 | Disposition: A | Discharge status: Home / Self Care | Payer: 59 | Source: Ambulatory Visit | Attending: Adult Health | Admitting: Adult Health

## 2016-12-06 VITALS — BP 130/82 | HR 67 | Ht 65.75 in | Wt 121.8 lb

## 2016-12-06 DIAGNOSIS — Z3041 Encounter for surveillance of contraceptive pills: Secondary | ICD-10-CM

## 2016-12-06 DIAGNOSIS — Z01419 Encounter for gynecological examination (general) (routine) without abnormal findings: Secondary | ICD-10-CM

## 2016-12-06 DIAGNOSIS — Z1211 Encounter for screening for malignant neoplasm of colon: Secondary | ICD-10-CM | POA: Diagnosis not present

## 2016-12-06 DIAGNOSIS — Z1212 Encounter for screening for malignant neoplasm of rectum: Secondary | ICD-10-CM | POA: Diagnosis not present

## 2016-12-06 LAB — HEMOCCULT GUIAC POC 1CARD (OFFICE): Fecal Occult Blood, POC: NEGATIVE

## 2016-12-06 MED ORDER — NORGESTIM-ETH ESTRAD TRIPHASIC 0.18/0.215/0.25 MG-35 MCG PO TABS: 1.0000 | ORAL_TABLET | Freq: Every day | ORAL | 99 refills | Status: DC

## 2016-12-06 MED ORDER — VALACYCLOVIR HCL 1 G PO TABS: ORAL_TABLET | ORAL | 3 refills | Status: DC

## 2016-12-06 MED FILL — valACYclovir HCL 1 GM TABS: 1 | 90 days supply | Qty: 90 | Fill #0

## 2016-12-06 MED FILL — TRI-PREVIFEM TABLET: 0.18/0.215/ | 84 days supply | Qty: 84 | Fill #0

## 2016-12-06 NOTE — Progress Notes (Signed)
Patient ID: Jennifer Harris, female   DOB: 07/04/1975, 41 y.o.   MRN: [id number] History of Present Illness: Jennifer Harris is a 41 year old white female, married in for a well woman gyn exam and pap. PCP is Dr Lubertha SouthSteve Luking.   Current Medications, Allergies, Past Medical History, Past Surgical History, Family History and Social History were reviewed in [facility] electronic medical record.     Review of Systems: Patient denies any headaches, hearing loss, fatigue, blurred vision, shortness of breath, chest pain, abdominal pain, problems with bowel movements, urination, or intercourse. No joint pain or mood swings. Happy with her OCs.   Physical Exam:BP 130/82 (BP Location: Left Arm, Patient Position: Sitting, Cuff Size: Normal)   Pulse 67   Ht 5' 5.75" (1.67 m)   Wt 121 lb 12.8 oz (55.2 kg)   LMP 11/28/2016   BMI 19.81 kg/m  General:  Well developed, well nourished, no acute distress Skin:  Warm and dry Neck:  Midline trachea, normal thyroid, good ROM, no lymphadenopathy Lungs; Clear to auscultation bilaterally Breast:  No dominant palpable mass, retraction, or nipple discharge Cardiovascular: Regular rate and rhythm Abdomen:  Soft, non tender, no hepatosplenomegaly Pelvic:  External genitalia is normal in appearance, no lesions.  The vagina is normal in appearance. Urethra has no lesions or masses. The cervix is bulbous.Pap with HPV performed.  Uterus is felt to be normal size, shape, and contour.  No adnexal masses or tenderness noted.Bladder is non tender, no masses felt. Rectal: Good sphincter tone, no polyps, or hemorrhoids felt.  Hemoccult negative. Extremities/musculoskeletal:  No swelling or varicosities noted, no clubbing or cyanosis Psych:  No mood changes, alert and cooperative,seems happy PHQ 2 score 0.  Impression: 1. Encounter for gynecological examination with Papanicolaou smear of cervix   2. Encounter for surveillance of contraceptive pills   3. Screening for  colorectal cancer       Plan: Physical in 1 year Pap in 3 if normal Mammogram yearly Labs in near future Meds ordered this encounter  Medications  . Norgestimate-Ethinyl Estradiol Triphasic (TRI-SPRINTEC) 0.18/0.215/0.25 MG-35 MCG tablet    Sig: Take 1 tablet by mouth daily.    Dispense:  84 tablet    Refill:  PRN    Order Specific Question:   Supervising Provider    Answer:   Despina HiddenEURE, LUTHER H [[id number]]  . valACYclovir (VALTREX) 1000 MG tablet    Sig: TAKE AS DIRECTED    Dispense:  90 tablet    Refill:  3    Order Specific Question:   Supervising Provider    Answer:   Duane LopeEURE, LUTHER H [[id number]]

## 2016-12-11 LAB — CYTOLOGY - PAP
Diagnosis: NEGATIVE
HPV (WINDOPATH): NOT DETECTED

## 2016-12-14 DIAGNOSIS — H5213 Myopia, bilateral: Secondary | ICD-10-CM | POA: Diagnosis not present

## 2017-04-08 MED FILL — NORG-EE 0.18-0.215-0.25/0.0: 0.18/0.215/ | 84 days supply | Qty: 84 | Fill #1

## 2017-07-03 MED FILL — TRI FEMYNOR 28 TABLET: 0.18/0.215/ | 84 days supply | Qty: 84 | Fill #2

## 2017-09-23 MED FILL — TRI FEMYNOR 28 TABLET: 0.18/0.215/ | 84 days supply | Qty: 84 | Fill #3

## 2017-10-11 ENCOUNTER — Encounter: Payer: Self-pay | Admitting: Nurse Practitioner

## 2017-10-11 ENCOUNTER — Ambulatory Visit (INDEPENDENT_AMBULATORY_CARE_PROVIDER_SITE_OTHER): Payer: No Typology Code available for payment source | Admitting: Nurse Practitioner

## 2017-10-11 VITALS — BP 128/76 | Temp 98.0°F | Ht 65.75 in | Wt 131.2 lb

## 2017-10-11 DIAGNOSIS — B9689 Other specified bacterial agents as the cause of diseases classified elsewhere: Secondary | ICD-10-CM

## 2017-10-11 DIAGNOSIS — J019 Acute sinusitis, unspecified: Secondary | ICD-10-CM | POA: Diagnosis not present

## 2017-10-11 MED ORDER — METHYLPREDNISOLONE ACETATE 40 MG/ML IJ SUSP
40.0000 mg | Freq: Once | INTRAMUSCULAR | Status: AC
  Administered 2017-10-11: 40 mg via INTRAMUSCULAR

## 2017-10-11 MED ORDER — AMOXICILLIN-POT CLAVULANATE 875-125 MG PO TABS: 1.0000 | ORAL_TABLET | Freq: Two times a day (BID) | ORAL | 0 refills | Status: DC

## 2017-10-11 NOTE — Patient Instructions (Signed)
Icy Hot smart relief TENS unit Massage therapy nasacort AQ

## 2017-10-12 ENCOUNTER — Encounter: Payer: Self-pay | Admitting: Nurse Practitioner

## 2017-10-12 NOTE — Progress Notes (Signed)
Subjective: Presents with complaints of sinus symptoms over the past 2 to 3 weeks.  Complaints of facial area pressure.  Now producing green mucus.  No fever or sore throat.  Slight ear pain and pressure bilaterally.  Temporary relief with OTC meds.  Objective:   BP 128/76   Temp 98 F (36.7 C) (Oral)   Ht 5' 5.75" (1.67 m)   Wt 131 lb 3.2 oz (59.5 kg)   BMI 21.34 kg/m  NAD.  Alert, oriented.  TMs clear effusion, no erythema.  Pharynx injected with green PND noted.  Neck supple with mild soft anterior adenopathy.  Lungs clear.  Heart regular rate and rhythm.  Assessment:  Acute bacterial rhinosinusitis - Plan: methylPREDNISolone acetate (DEPO-MEDROL) injection 40 mg    Plan:   Meds ordered this encounter  Medications  . DISCONTD: amoxicillin-clavulanate (AUGMENTIN) 875-125 MG tablet    Sig: Take 1 tablet by mouth 2 (two) times daily.    Dispense:  20 tablet    Refill:  0    Order Specific Question:   Supervising Provider    Answer:   Merlyn AlbertLUKING, WILLIAM S [[id number]]  . methylPREDNISolone acetate (DEPO-MEDROL) injection 40 mg  . amoxicillin-clavulanate (AUGMENTIN) 875-125 MG tablet    Sig: Take 1 tablet by mouth 2 (two) times daily.    Dispense:  20 tablet    Refill:  0   Recommend daily antihistamine and steroid nasal spray.  Call back next week if no improvement, consider prednisone taper at that time.

## 2017-10-20 ENCOUNTER — Encounter: Payer: Self-pay | Admitting: Nurse Practitioner

## 2017-10-21 ENCOUNTER — Other Ambulatory Visit: Payer: Self-pay | Admitting: Nurse Practitioner

## 2017-10-21 MED ORDER — PREDNISONE 20 MG PO TABS: ORAL_TABLET | ORAL | 0 refills | Status: DC

## 2017-10-21 MED FILL — predniSONE 20 MG TABS: 20 | 8 days supply | Qty: 17 | Fill #0

## 2017-10-29 ENCOUNTER — Other Ambulatory Visit: Payer: Self-pay | Admitting: Adult Health

## 2017-10-29 DIAGNOSIS — Z1231 Encounter for screening mammogram for malignant neoplasm of breast: Secondary | ICD-10-CM

## 2017-12-09 ENCOUNTER — Ambulatory Visit (HOSPITAL_COMMUNITY)
Admission: RE | Admit: 2017-12-09 | Discharge: 2017-12-09 | Disposition: A | Discharge status: Home / Self Care | Payer: No Typology Code available for payment source | Source: Ambulatory Visit | Attending: Adult Health | Admitting: Adult Health

## 2017-12-09 DIAGNOSIS — Z1231 Encounter for screening mammogram for malignant neoplasm of breast: Secondary | ICD-10-CM | POA: Insufficient documentation

## 2017-12-11 ENCOUNTER — Ambulatory Visit (INDEPENDENT_AMBULATORY_CARE_PROVIDER_SITE_OTHER): Payer: No Typology Code available for payment source | Admitting: Adult Health

## 2017-12-11 ENCOUNTER — Other Ambulatory Visit: Payer: Self-pay

## 2017-12-11 ENCOUNTER — Encounter: Payer: Self-pay | Admitting: Adult Health

## 2017-12-11 VITALS — BP 143/80 | HR 69 | Ht 66.0 in | Wt 132.0 lb

## 2017-12-11 DIAGNOSIS — Z1212 Encounter for screening for malignant neoplasm of rectum: Secondary | ICD-10-CM

## 2017-12-11 DIAGNOSIS — Z01419 Encounter for gynecological examination (general) (routine) without abnormal findings: Secondary | ICD-10-CM | POA: Diagnosis not present

## 2017-12-11 DIAGNOSIS — Z3041 Encounter for surveillance of contraceptive pills: Secondary | ICD-10-CM | POA: Diagnosis not present

## 2017-12-11 DIAGNOSIS — Z1211 Encounter for screening for malignant neoplasm of colon: Secondary | ICD-10-CM | POA: Diagnosis not present

## 2017-12-11 LAB — HEMOCCULT GUIAC POC 1CARD (OFFICE): Fecal Occult Blood, POC: NEGATIVE

## 2017-12-11 MED ORDER — NORGESTIM-ETH ESTRAD TRIPHASIC 0.18/0.215/0.25 MG-35 MCG PO TABS: 1.0000 | ORAL_TABLET | Freq: Every day | ORAL | 99 refills | Status: DC

## 2017-12-11 MED ORDER — AZITHROMYCIN 250 MG PO TABS: ORAL_TABLET | ORAL | 0 refills | Status: DC

## 2017-12-11 MED FILL — TRI FEMYNOR 28 TABLET: 0.18/0.215/ | 84 days supply | Qty: 84 | Fill #0

## 2017-12-11 NOTE — Progress Notes (Signed)
Patient ID: Jennifer Harris, female   DOB: 12/19/1975, 42 y.o.   MRN: [id number] History of Present Illness: Jennifer Harris is a 42 year old white female,married in for well woman gyn exam, she had normal pap with negative HPV 12/06/16.Wants OCs refilled. PCP is Dr Lilyan PuntScott Luking.   Current Medications, Allergies, Past Medical History, Past Surgical History, Family History and Social History were reviewed in [facility] electronic medical record.     Review of Systems: Patient denies any  hearing loss, fatigue, blurred vision, shortness of breath, chest pain, abdominal pain, problems with bowel movements, urination, or intercourse. No joint pain or mood swings. Has headache today and ears itching and hurting.   Physical Exam:BP (!) 143/80 (BP Location: Left Arm, Patient Position: Sitting, Cuff Size: Normal)   Pulse 69   Ht 5\' 6"  (1.676 m)   Wt 132 lb (59.9 kg)   LMP 11/27/2017 (Exact Date)   BMI 21.31 kg/m  General:  Well developed, well nourished, no acute distress Skin:  Warm and dry, right ear red in canal, +TM pearly with light reflex,+wax in left ear Neck:  Midline trachea, normal thyroid, good ROM, no lymphadenopathy Lungs; Clear to auscultation bilaterally Breast:  No dominant palpable mass, retraction, or nipple discharge Cardiovascular: Regular rate and rhythm Abdomen:  Soft, non tender, no hepatosplenomegaly Pelvic:  External genitalia is normal in appearance, no lesions.  The vagina is normal in appearance. Urethra has no lesions or masses. The cervix is bulbous.  Uterus is felt to be normal size, shape, and contour.  No adnexal masses or tenderness noted.Bladder is non tender, no masses felt. Rectal: Good sphincter tone, no polyps, or hemorrhoids felt.  Hemoccult negative. Extremities/musculoskeletal:  No swelling or varicosities noted, no clubbing or cyanosis Psych:  No mood changes, alert and cooperative,seems happy PHQ 2 score 0. Examination chaperoned by Marchelle FolksAmanda Rash,  LPN.  Impression: 1. Encounter for well woman exam with routine gynecological exam   2. Encounter for surveillance of contraceptive pills   3. Screening for colorectal cancer       Plan: Meds ordered this encounter  Medications  . Norgestimate-Ethinyl Estradiol Triphasic (TRI-SPRINTEC) 0.18/0.215/0.25 MG-35 MCG tablet    Sig: Take 1 tablet by mouth daily.    Dispense:  84 tablet    Refill:  PRN    Order Specific Question:   Supervising Provider    Answer:   Despina HiddenEURE, LUTHER H [[id number]]  . azithromycin (ZITHROMAX) 250 MG tablet    Sig: Take 2 now and then 1 daily for 4 days    Dispense:  6 tablet    Refill:  0    Order Specific Question:   Supervising Provider    Answer:   Duane LopeEURE, LUTHER H [[id number]]  Try claritin, or zyrtec or allegra D and increase fluids.  Physical in 1 years Pap in 2021 Mammogram yearly

## 2018-02-05 ENCOUNTER — Ambulatory Visit: Payer: No Typology Code available for payment source | Admitting: Advanced Practice Midwife

## 2018-02-05 VITALS — BP 143/95 | HR 84 | Ht 66.0 in | Wt 133.4 lb

## 2018-02-05 DIAGNOSIS — N760 Acute vaginitis: Secondary | ICD-10-CM | POA: Diagnosis not present

## 2018-02-05 MED ORDER — CIPROFLOXACIN HCL 500 MG PO TABS
500.0000 mg | ORAL_TABLET | Freq: Two times a day (BID) | ORAL | 0 refills | Status: DC
Start: 2018-02-05 — End: 2018-04-01

## 2018-02-05 MED ORDER — FLUCONAZOLE 150 MG PO TABS: 150.0000 mg | ORAL_TABLET | Freq: Once | ORAL | 0 refills | Status: AC

## 2018-02-05 NOTE — Progress Notes (Signed)
CHIEF COMPLAINT/HPI:  42 y.o. female complains of white and yellowish vaginal discharge for about a week  Used a 3 day monistat without any relief.  Using topical Nystatin which helps w/external symptoms. Denies abnormal vaginal bleeding, significant pelvic pain or fever.  some itch,  irritation, and odor. No UTI symptoms.   Denies history of known exposure to STD or symptoms in partner.  Patient's last menstrual period was 01/12/2018. Marland Kitchen   Review of Systems  Constitutional: Negative for fever and chills Eyes: Negative for visual disturbances Respiratory: Negative for shortness of breath, dyspnea Cardiovascular: Negative for chest pain or palpitations  Gastrointestinal: Negative for vomiting, diarrhea and constipation Genitourinary: Negative for dysuria and urgency Musculoskeletal: Negative for back pain, joint pain, myalgias  Neurological: Negative for dizziness and headaches    Past Medical History: Past Medical History:  Diagnosis Date  . BV (bacterial vaginosis) 05/29/2013  . IBS (irritable bowel syndrome)   . Other and unspecified ovarian cyst 05/29/2013  . RLQ abdominal pain 05/29/2013  . Vaginal discharge 07/06/2013  . Vitamin D deficiency     Past Surgical History: Past Surgical History:  Procedure Laterality Date  . FLEXIBLE SIGMOIDOSCOPY  2010    Obstetrical History: OB History    Gravida  1   Para  1   Term  1   Preterm      AB      Living  1     SAB      TAB      Ectopic      Multiple      Live Births  1           Social History: Social History   Socioeconomic History  . Marital status: Married    Spouse name: Not on file  . Number of children: Not on file  . Years of education: Not on file  . Highest education level: Not on file  Occupational History  . Not on file  Social Needs  . Financial resource strain: Not on file  . Food insecurity:    Worry: Not on file    Inability: Not on file  . Transportation needs:    Medical: Not on  file    Non-medical: Not on file  Tobacco Use  . Smoking status: Never Smoker  . Smokeless tobacco: Never Used  Substance and Sexual Activity  . Alcohol use: No  . Drug use: No  . Sexual activity: Yes    Birth control/protection: Pill  Lifestyle  . Physical activity:    Days per week: Not on file    Minutes per session: Not on file  . Stress: Not on file  Relationships  . Social connections:    Talks on phone: Not on file    Gets together: Not on file    Attends religious service: Not on file    Active member of club or organization: Not on file    Attends meetings of clubs or organizations: Not on file    Relationship status: Not on file  Other Topics Concern  . Not on file  Social History Narrative  . Not on file    Family History: Family History  Problem Relation Age of Onset  . Cancer Mother        ovarian cancer  . COPD Father   . Hypertension Brother   . ADD / ADHD Son   . Stroke Maternal Grandmother     Allergies: No Known Allergies  PHYSICAL EXAM: Physical Examination: General appearance - well appearing, and in no distress Mental status - alert, oriented to person, place, and time Chest:  Normal respiratory effort Heart - normal rate and regular rhythm Abdomen:  Soft, nontender Pelvic: SSE:  yellow discharge, wet prep neg trich, positive clue, positive wbc, no yeast Musculoskeletal:  Normal range of motion without pain Extremities:  No edema    Labs: No results found for this or any previous visit (from the past 24 hour(s)).   Assessment: Patient Active Problem List   Diagnosis Date Noted  . Vaginal discharge 07/06/2013  . Irregular intermenstrual bleeding 05/29/2013  . RLQ abdominal pain 05/29/2013  . BV (bacterial vaginosis) 05/29/2013  . Other and unspecified ovarian cyst 05/29/2013  . DIARRHEA 06/13/2009  . LOW BACK PAIN 02/18/2006    Plan:  No orders of the defined types were placed in this encounter.  Vaginitis:   Continue nystatin, add diflucan and a one time dose of nUVESSA.  D/t WBC, rx for cipro sent--take if not getting better by Friday/saturday  Jacklyn ShellFrances Cresenzo-Dishmon 02/05/18 9:10 AM

## 2018-03-13 MED FILL — TRI FEMYNOR 28 TABLET: 0.18/0.215/ | 84 days supply | Qty: 84 | Fill #1

## 2018-03-25 ENCOUNTER — Other Ambulatory Visit: Payer: Self-pay | Admitting: Adult Health

## 2018-03-25 MED ORDER — METAXALONE 400 MG PO TABS
400.0000 mg | ORAL_TABLET | Freq: Three times a day (TID) | ORAL | 1 refills | Status: DC
Start: 2018-03-25 — End: 2018-03-27

## 2018-03-25 NOTE — Progress Notes (Signed)
Has left sided neck pain, try ice and will rx skelaxin Keep check on BP, is elevated when in pain

## 2018-03-27 ENCOUNTER — Telehealth: Payer: Self-pay | Admitting: Adult Health

## 2018-03-27 MED ORDER — METAXALONE 400 MG PO TABS: 800.0000 mg | ORAL_TABLET | Freq: Three times a day (TID) | ORAL | 1 refills | Status: DC

## 2018-03-27 NOTE — Telephone Encounter (Signed)
Mardelle Matte from [facility] called, only doe available is 800 mg so will change to that.

## 2018-04-01 ENCOUNTER — Encounter: Payer: Self-pay | Admitting: Family Medicine

## 2018-04-01 ENCOUNTER — Ambulatory Visit (INDEPENDENT_AMBULATORY_CARE_PROVIDER_SITE_OTHER): Payer: No Typology Code available for payment source | Admitting: Family Medicine

## 2018-04-01 VITALS — BP 152/86 | Temp 97.8°F | Ht 65.75 in | Wt 131.0 lb

## 2018-04-01 DIAGNOSIS — R030 Elevated blood-pressure reading, without diagnosis of hypertension: Secondary | ICD-10-CM | POA: Diagnosis not present

## 2018-04-01 DIAGNOSIS — Z13220 Encounter for screening for lipoid disorders: Secondary | ICD-10-CM

## 2018-04-01 DIAGNOSIS — M542 Cervicalgia: Secondary | ICD-10-CM | POA: Diagnosis not present

## 2018-04-01 NOTE — Patient Instructions (Signed)
DASH Eating Plan  DASH stands for "Dietary Approaches to Stop Hypertension." The DASH eating plan is a healthy eating plan that has been shown to reduce high blood pressure (hypertension). It may also reduce your risk for type 2 diabetes, heart disease, and stroke. The DASH eating plan may also help with weight loss.  What are tips for following this plan?    General guidelines   Avoid eating more than 2,300 mg (milligrams) of salt (sodium) a day. If you have hypertension, you may need to reduce your sodium intake to 1,500 mg a day.   Limit alcohol intake to no more than 1 drink a day for nonpregnant women and 2 drinks a day for men. One drink equals 12 oz of beer, 5 oz of wine, or 1 oz of hard liquor.   Work with your health care provider to maintain a healthy body weight or to lose weight. Ask what an ideal weight is for you.   Get at least 30 minutes of exercise that causes your heart to beat faster (aerobic exercise) most days of the week. Activities may include walking, swimming, or biking.   Work with your health care provider or diet and nutrition specialist (dietitian) to adjust your eating plan to your individual calorie needs.  Reading food labels     Check food labels for the amount of sodium per serving. Choose foods with less than 5 percent of the Daily Value of sodium. Generally, foods with less than 300 mg of sodium per serving fit into this eating plan.   To find whole grains, look for the word "whole" as the first word in the ingredient list.  Shopping   Buy products labeled as "low-sodium" or "no salt added."   Buy fresh foods. Avoid canned foods and premade or frozen meals.  Cooking   Avoid adding salt when cooking. Use salt-free seasonings or herbs instead of table salt or sea salt. Check with your health care provider or pharmacist before using salt substitutes.   Do not fry foods. Cook foods using healthy methods such as baking, boiling, grilling, and broiling instead.   Cook with  heart-healthy oils, such as olive, canola, soybean, or sunflower oil.  Meal planning   Eat a balanced diet that includes:  ? 5 or more servings of fruits and vegetables each day. At each meal, try to fill half of your plate with fruits and vegetables.  ? Up to 6-8 servings of whole grains each day.  ? Less than 6 oz of lean meat, poultry, or fish each day. A 3-oz serving of meat is about the same size as a deck of cards. One egg equals 1 oz.  ? 2 servings of low-fat dairy each day.  ? A serving of nuts, seeds, or beans 5 times each week.  ? Heart-healthy fats. Healthy fats called Omega-3 fatty acids are found in foods such as flaxseeds and coldwater fish, like sardines, salmon, and mackerel.   Limit how much you eat of the following:  ? Canned or prepackaged foods.  ? Food that is high in trans fat, such as fried foods.  ? Food that is high in saturated fat, such as fatty meat.  ? Sweets, desserts, sugary drinks, and other foods with added sugar.  ? Full-fat dairy products.   Do not salt foods before eating.   Try to eat at least 2 vegetarian meals each week.   Eat more home-cooked food and less restaurant, buffet, and fast food.     When eating at a restaurant, ask that your food be prepared with less salt or no salt, if possible.  What foods are recommended?  The items listed may not be a complete list. Talk with your dietitian about what dietary choices are best for you.  Grains  Whole-grain or whole-wheat bread. Whole-grain or whole-wheat pasta. Brown rice. Oatmeal. Quinoa. Bulgur. Whole-grain and low-sodium cereals. Pita bread. Low-fat, low-sodium crackers. Whole-wheat flour tortillas.  Vegetables  Fresh or frozen vegetables (raw, steamed, roasted, or grilled). Low-sodium or reduced-sodium tomato and vegetable juice. Low-sodium or reduced-sodium tomato sauce and tomato paste. Low-sodium or reduced-sodium canned vegetables.  Fruits  All fresh, dried, or frozen fruit. Canned fruit in natural juice (without  added sugar).  Meat and other protein foods  Skinless chicken or turkey. Ground chicken or turkey. Pork with fat trimmed off. Fish and seafood. Egg whites. Dried beans, peas, or lentils. Unsalted nuts, nut butters, and seeds. Unsalted canned beans. Lean cuts of beef with fat trimmed off. Low-sodium, lean deli meat.  Dairy  Low-fat (1%) or fat-free (skim) milk. Fat-free, low-fat, or reduced-fat cheeses. Nonfat, low-sodium ricotta or cottage cheese. Low-fat or nonfat yogurt. Low-fat, low-sodium cheese.  Fats and oils  Soft margarine without trans fats. Vegetable oil. Low-fat, reduced-fat, or light mayonnaise and salad dressings (reduced-sodium). Canola, safflower, olive, soybean, and sunflower oils. Avocado.  Seasoning and other foods  Herbs. Spices. Seasoning mixes without salt. Unsalted popcorn and pretzels. Fat-free sweets.  What foods are not recommended?  The items listed may not be a complete list. Talk with your dietitian about what dietary choices are best for you.  Grains  Baked goods made with fat, such as croissants, muffins, or some breads. Dry pasta or rice meal packs.  Vegetables  Creamed or fried vegetables. Vegetables in a cheese sauce. Regular canned vegetables (not low-sodium or reduced-sodium). Regular canned tomato sauce and paste (not low-sodium or reduced-sodium). Regular tomato and vegetable juice (not low-sodium or reduced-sodium). Pickles. Olives.  Fruits  Canned fruit in a light or heavy syrup. Fried fruit. Fruit in cream or butter sauce.  Meat and other protein foods  Fatty cuts of meat. Ribs. Fried meat. Bacon. Sausage. Bologna and other processed lunch meats. Salami. Fatback. Hotdogs. Bratwurst. Salted nuts and seeds. Canned beans with added salt. Canned or smoked fish. Whole eggs or egg yolks. Chicken or turkey with skin.  Dairy  Whole or 2% milk, cream, and half-and-half. Whole or full-fat cream cheese. Whole-fat or sweetened yogurt. Full-fat cheese. Nondairy creamers. Whipped toppings.  Processed cheese and cheese spreads.  Fats and oils  Butter. Stick margarine. Lard. Shortening. Ghee. Bacon fat. Tropical oils, such as coconut, palm kernel, or palm oil.  Seasoning and other foods  Salted popcorn and pretzels. Onion salt, garlic salt, seasoned salt, table salt, and sea salt. Worcestershire sauce. Tartar sauce. Barbecue sauce. Teriyaki sauce. Soy sauce, including reduced-sodium. Steak sauce. Canned and packaged gravies. Fish sauce. Oyster sauce. Cocktail sauce. Horseradish that you find on the shelf. Ketchup. Mustard. Meat flavorings and tenderizers. Bouillon cubes. Hot sauce and Tabasco sauce. Premade or packaged marinades. Premade or packaged taco seasonings. Relishes. Regular salad dressings.  Where to find more information:   National Heart, Lung, and Blood Institute: www.nhlbi.nih.gov   American Heart Association: www.heart.org  Summary   The DASH eating plan is a healthy eating plan that has been shown to reduce high blood pressure (hypertension). It may also reduce your risk for type 2 diabetes, heart disease, and stroke.   With the   DASH eating plan, you should limit salt (sodium) intake to 2,300 mg a day. If you have hypertension, you may need to reduce your sodium intake to 1,500 mg a day.   When on the DASH eating plan, aim to eat more fresh fruits and vegetables, whole grains, lean proteins, low-fat dairy, and heart-healthy fats.   Work with your health care provider or diet and nutrition specialist (dietitian) to adjust your eating plan to your individual calorie needs.  This information is not intended to replace advice given to you by your health care provider. Make sure you discuss any questions you have with your health care provider.  Document Released: 03/01/2011 Document Revised: 03/05/2016 Document Reviewed: 03/05/2016  Elsevier Interactive Patient Education  2019 [employer] Inc[employer]

## 2018-04-01 NOTE — Progress Notes (Signed)
   Subjective:    Patient ID: Jennifer Harris, female    DOB: 07/31/1975, 43 y.o.   MRN: [id number]  HPI  Patient is here today with complaints of bp issues and neck pain.She states when she starts having the pain her bp rises. She has been taking Metaxalone 800 mg Tid prescribed by [employer] Tylenol. When these medications wear off she notices that the pain will come back and her bp will rise.  Patient relates neck pain on the left side radiates into the shoulder does not radiate down the arm She also relates intermittent weakness in both hands comes and goes present over the past few months Neck pain with radiation into the shoulder is been over the past week Does not wake her up at night  Blood pressure is been elevated on multiple accounts especially when she is in more pain in addition to this she has a family history of high blood pressure she does not smoke  Review of Systems  Constitutional: Negative for activity change and appetite change.  HENT: Negative for congestion and rhinorrhea.   Respiratory: Negative for cough and shortness of breath.   Cardiovascular: Negative for chest pain and leg swelling.  Gastrointestinal: Negative for abdominal pain, nausea and vomiting.  Skin: Negative for color change.  Neurological: Negative for dizziness and weakness.  Psychiatric/Behavioral: Negative for agitation and confusion.       Objective:   Physical Exam Vitals signs reviewed.  Constitutional:      General: She is not in acute distress. HENT:     Head: Normocephalic.  Cardiovascular:     Rate and Rhythm: Normal rate and regular rhythm.     Heart sounds: Normal heart sounds. No murmur.  Pulmonary:     Effort: Pulmonary effort is normal.     Breath sounds: Normal breath sounds.  Lymphadenopathy:     Cervical: No cervical adenopathy.  Neurological:     Mental Status: She is alert.  Psychiatric:        Behavior: Behavior normal.     Reflexes in both arms  are good strength in both arms is good      Assessment & Plan:  HTN Recommend low-salt diet regular physical activity try to keep blood pressure under good control Cardio exercise 150 minutes/week Follow-up within 4 to 6 weeks to check blood pressure  Neck pain x-rays indicated use ibuprofen sparingly Follow-up if progressive troubles or worse warning signs discussed in detail hold off on any MRI currently No sign of weakness on today's exam

## 2018-04-02 ENCOUNTER — Ambulatory Visit (HOSPITAL_COMMUNITY)
Admission: RE | Admit: 2018-04-02 | Discharge: 2018-04-02 | Disposition: A | Discharge status: Home / Self Care | Payer: No Typology Code available for payment source | Source: Ambulatory Visit | Attending: Family Medicine | Admitting: Family Medicine

## 2018-04-02 DIAGNOSIS — M542 Cervicalgia: Secondary | ICD-10-CM | POA: Insufficient documentation

## 2018-04-09 ENCOUNTER — Other Ambulatory Visit: Payer: Self-pay | Admitting: Adult Health

## 2018-04-09 MED ORDER — NORETHINDRONE 0.35 MG PO TABS
1.0000 | ORAL_TABLET | Freq: Every day | ORAL | 4 refills | Status: DC
Start: 2018-04-09 — End: 2018-12-15

## 2018-04-09 MED FILL — NORLYDA 0.35 MG TABS: 0.35 | 84 days supply | Qty: 84 | Fill #0

## 2018-04-09 NOTE — Progress Notes (Signed)
BP staying elevated, stop tri sprintec and start Micronor, use condoms, keep check on BP and F/U with PCP

## 2018-04-17 LAB — BASIC METABOLIC PANEL
BUN / CREAT RATIO: 12 (ref 9–23)
BUN: 9 mg/dL (ref 6–24)
CO2: 23 mmol/L (ref 20–29)
CREATININE: 0.77 mg/dL (ref 0.57–1.00)
Calcium: 9.7 mg/dL (ref 8.7–10.2)
Chloride: 102 mmol/L (ref 96–106)
GFR calc Af Amer: 109 mL/min/{1.73_m2} (ref >59)
GFR calc non Af Amer: 95 mL/min/{1.73_m2} (ref >59)
Glucose: 90 mg/dL (ref 65–99)
Potassium: 4.4 mmol/L (ref 3.5–5.2)
SODIUM: 140 mmol/L (ref 134–144)

## 2018-04-17 LAB — LIPID PANEL
Chol/HDL Ratio: 2.5 ratio (ref 0.0–4.4)
Cholesterol, Total: 179 mg/dL (ref 100–199)
HDL: 72 mg/dL (ref >39)
LDL CALC: 95 mg/dL (ref 0–99)
TRIGLYCERIDES: 59 mg/dL (ref 0–149)
VLDL Cholesterol Cal: 12 mg/dL (ref 5–40)

## 2018-04-30 ENCOUNTER — Encounter: Payer: Self-pay | Admitting: Family Medicine

## 2018-04-30 ENCOUNTER — Ambulatory Visit (INDEPENDENT_AMBULATORY_CARE_PROVIDER_SITE_OTHER): Payer: No Typology Code available for payment source | Admitting: Family Medicine

## 2018-04-30 VITALS — BP 112/80 | Ht 65.75 in | Wt 123.0 lb

## 2018-04-30 DIAGNOSIS — R030 Elevated blood-pressure reading, without diagnosis of hypertension: Secondary | ICD-10-CM

## 2018-04-30 NOTE — Patient Instructions (Signed)
DASH Eating Plan  DASH stands for "Dietary Approaches to Stop Hypertension." The DASH eating plan is a healthy eating plan that has been shown to reduce high blood pressure (hypertension). It may also reduce your risk for type 2 diabetes, heart disease, and stroke. The DASH eating plan may also help with weight loss.  What are tips for following this plan?    General guidelines   Avoid eating more than 2,300 mg (milligrams) of salt (sodium) a day. If you have hypertension, you may need to reduce your sodium intake to 1,500 mg a day.   Limit alcohol intake to no more than 1 drink a day for nonpregnant women and 2 drinks a day for men. One drink equals 12 oz of beer, 5 oz of wine, or 1 oz of hard liquor.   Work with your health care provider to maintain a healthy body weight or to lose weight. Ask what an ideal weight is for you.   Get at least 30 minutes of exercise that causes your heart to beat faster (aerobic exercise) most days of the week. Activities may include walking, swimming, or biking.   Work with your health care provider or diet and nutrition specialist (dietitian) to adjust your eating plan to your individual calorie needs.  Reading food labels     Check food labels for the amount of sodium per serving. Choose foods with less than 5 percent of the Daily Value of sodium. Generally, foods with less than 300 mg of sodium per serving fit into this eating plan.   To find whole grains, look for the word "whole" as the first word in the ingredient list.  Shopping   Buy products labeled as "low-sodium" or "no salt added."   Buy fresh foods. Avoid canned foods and premade or frozen meals.  Cooking   Avoid adding salt when cooking. Use salt-free seasonings or herbs instead of table salt or sea salt. Check with your health care provider or pharmacist before using salt substitutes.   Do not fry foods. Cook foods using healthy methods such as baking, boiling, grilling, and broiling instead.   Cook with  heart-healthy oils, such as olive, canola, soybean, or sunflower oil.  Meal planning   Eat a balanced diet that includes:  ? 5 or more servings of fruits and vegetables each day. At each meal, try to fill half of your plate with fruits and vegetables.  ? Up to 6-8 servings of whole grains each day.  ? Less than 6 oz of lean meat, poultry, or fish each day. A 3-oz serving of meat is about the same size as a deck of cards. One egg equals 1 oz.  ? 2 servings of low-fat dairy each day.  ? A serving of nuts, seeds, or beans 5 times each week.  ? Heart-healthy fats. Healthy fats called Omega-3 fatty acids are found in foods such as flaxseeds and coldwater fish, like sardines, salmon, and mackerel.   Limit how much you eat of the following:  ? Canned or prepackaged foods.  ? Food that is high in trans fat, such as fried foods.  ? Food that is high in saturated fat, such as fatty meat.  ? Sweets, desserts, sugary drinks, and other foods with added sugar.  ? Full-fat dairy products.   Do not salt foods before eating.   Try to eat at least 2 vegetarian meals each week.   Eat more home-cooked food and less restaurant, buffet, and fast food.     When eating at a restaurant, ask that your food be prepared with less salt or no salt, if possible.  What foods are recommended?  The items listed may not be a complete list. Talk with your dietitian about what dietary choices are best for you.  Grains  Whole-grain or whole-wheat bread. Whole-grain or whole-wheat pasta. Brown rice. Oatmeal. Quinoa. Bulgur. Whole-grain and low-sodium cereals. Pita bread. Low-fat, low-sodium crackers. Whole-wheat flour tortillas.  Vegetables  Fresh or frozen vegetables (raw, steamed, roasted, or grilled). Low-sodium or reduced-sodium tomato and vegetable juice. Low-sodium or reduced-sodium tomato sauce and tomato paste. Low-sodium or reduced-sodium canned vegetables.  Fruits  All fresh, dried, or frozen fruit. Canned fruit in natural juice (without  added sugar).  Meat and other protein foods  Skinless chicken or turkey. Ground chicken or turkey. Pork with fat trimmed off. Fish and seafood. Egg whites. Dried beans, peas, or lentils. Unsalted nuts, nut butters, and seeds. Unsalted canned beans. Lean cuts of beef with fat trimmed off. Low-sodium, lean deli meat.  Dairy  Low-fat (1%) or fat-free (skim) milk. Fat-free, low-fat, or reduced-fat cheeses. Nonfat, low-sodium ricotta or cottage cheese. Low-fat or nonfat yogurt. Low-fat, low-sodium cheese.  Fats and oils  Soft margarine without trans fats. Vegetable oil. Low-fat, reduced-fat, or light mayonnaise and salad dressings (reduced-sodium). Canola, safflower, olive, soybean, and sunflower oils. Avocado.  Seasoning and other foods  Herbs. Spices. Seasoning mixes without salt. Unsalted popcorn and pretzels. Fat-free sweets.  What foods are not recommended?  The items listed may not be a complete list. Talk with your dietitian about what dietary choices are best for you.  Grains  Baked goods made with fat, such as croissants, muffins, or some breads. Dry pasta or rice meal packs.  Vegetables  Creamed or fried vegetables. Vegetables in a cheese sauce. Regular canned vegetables (not low-sodium or reduced-sodium). Regular canned tomato sauce and paste (not low-sodium or reduced-sodium). Regular tomato and vegetable juice (not low-sodium or reduced-sodium). Pickles. Olives.  Fruits  Canned fruit in a light or heavy syrup. Fried fruit. Fruit in cream or butter sauce.  Meat and other protein foods  Fatty cuts of meat. Ribs. Fried meat. Bacon. Sausage. Bologna and other processed lunch meats. Salami. Fatback. Hotdogs. Bratwurst. Salted nuts and seeds. Canned beans with added salt. Canned or smoked fish. Whole eggs or egg yolks. Chicken or turkey with skin.  Dairy  Whole or 2% milk, cream, and half-and-half. Whole or full-fat cream cheese. Whole-fat or sweetened yogurt. Full-fat cheese. Nondairy creamers. Whipped toppings.  Processed cheese and cheese spreads.  Fats and oils  Butter. Stick margarine. Lard. Shortening. Ghee. Bacon fat. Tropical oils, such as coconut, palm kernel, or palm oil.  Seasoning and other foods  Salted popcorn and pretzels. Onion salt, garlic salt, seasoned salt, table salt, and sea salt. Worcestershire sauce. Tartar sauce. Barbecue sauce. Teriyaki sauce. Soy sauce, including reduced-sodium. Steak sauce. Canned and packaged gravies. Fish sauce. Oyster sauce. Cocktail sauce. Horseradish that you find on the shelf. Ketchup. Mustard. Meat flavorings and tenderizers. Bouillon cubes. Hot sauce and Tabasco sauce. Premade or packaged marinades. Premade or packaged taco seasonings. Relishes. Regular salad dressings.  Where to find more information:   National Heart, Lung, and Blood Institute: www.nhlbi.nih.gov   American Heart Association: www.heart.org  Summary   The DASH eating plan is a healthy eating plan that has been shown to reduce high blood pressure (hypertension). It may also reduce your risk for type 2 diabetes, heart disease, and stroke.   With the   DASH eating plan, you should limit salt (sodium) intake to 2,300 mg a day. If you have hypertension, you may need to reduce your sodium intake to 1,500 mg a day.   When on the DASH eating plan, aim to eat more fresh fruits and vegetables, whole grains, lean proteins, low-fat dairy, and heart-healthy fats.   Work with your health care provider or diet and nutrition specialist (dietitian) to adjust your eating plan to your individual calorie needs.  This information is not intended to replace advice given to you by your health care provider. Make sure you discuss any questions you have with your health care provider.  Document Released: 03/01/2011 Document Revised: 03/05/2016 Document Reviewed: 03/05/2016  Elsevier Interactive Patient Education  2019 [employer] Inc[employer]

## 2018-04-30 NOTE — Progress Notes (Signed)
   Subjective:    Patient ID: Jennifer Harris, female    DOB: [birth year], 43 y.o.   MRN: [id number]  HPI Patient returns today for a four week follow up on her blood pressures.   She was seen on 04/01/2018. She was advised at that time to decrease her salt intake and try to get some physical activity to gain better control. Cardio exercise 150 min/week.  She states she has tried working her her diet,but has not been able to do much on her physical activity.  Review of Systems  Constitutional: Negative for activity change, fatigue and fever.  HENT: Negative for congestion and rhinorrhea.   Respiratory: Negative for cough, chest tightness and shortness of breath.   Cardiovascular: Negative for chest pain and leg swelling.  Gastrointestinal: Negative for abdominal pain and nausea.  Skin: Negative for color change.  Neurological: Negative for dizziness and headaches.  Psychiatric/Behavioral: Negative for agitation and behavioral problems.       Objective:   Physical Exam Vitals signs reviewed.  Constitutional:      General: She is not in acute distress. HENT:     Head: Normocephalic and atraumatic.  Eyes:     General:        Right eye: No discharge.        Left eye: No discharge.  Neck:     Trachea: No tracheal deviation.  Cardiovascular:     Rate and Rhythm: Normal rate and regular rhythm.     Heart sounds: Normal heart sounds. No murmur.  Pulmonary:     Effort: Pulmonary effort is normal. No respiratory distress.     Breath sounds: Normal breath sounds.  Lymphadenopathy:     Cervical: No cervical adenopathy.  Skin:    General: Skin is warm and dry.  Neurological:     Mental Status: She is alert.     Coordination: Coordination normal.  Psychiatric:        Behavior: Behavior normal.           Assessment & Plan:  Blood pressure overall good control Diastolic number slightly elevated above 80 but not high enough to start medicine Diet exercise  recommended Follow-up if progressive troubles Patient will do regular follow-up with gynecology for her wellness if blood pressure starts rising she will follow-up with [place] low-salt diet regular exercise

## 2018-06-21 MED FILL — NORETHINDRONE 0.35 MG TAB: 0.35 | 84 days supply | Qty: 84 | Fill #0

## 2018-07-09 ENCOUNTER — Telehealth: Payer: Self-pay | Admitting: Adult Health

## 2018-07-09 MED ORDER — VALACYCLOVIR HCL 1 G PO TABS: ORAL_TABLET | ORAL | 3 refills | Status: AC

## 2018-07-09 NOTE — Telephone Encounter (Signed)
Left message that I refilled valtrex

## 2018-07-09 NOTE — Telephone Encounter (Signed)
Jennifer Harris is requesting a new RX for generic Valtrex for fever blisters.  She stated that her script ran out a long time ago, but she'd like to have some on hand incase she ever gets one.  [facility]  [phone]

## 2018-07-14 MED FILL — valACYclovir HCL 1 GM TABS: 1 | 90 days supply | Qty: 90 | Fill #0

## 2018-09-22 MED FILL — NORLYDA 0.35 MG TABS: 0.35 | 84 days supply | Qty: 84 | Fill #0

## 2018-11-14 ENCOUNTER — Other Ambulatory Visit (HOSPITAL_COMMUNITY): Payer: Self-pay | Admitting: Obstetrics and Gynecology

## 2018-11-14 DIAGNOSIS — Z1231 Encounter for screening mammogram for malignant neoplasm of breast: Secondary | ICD-10-CM

## 2018-12-15 ENCOUNTER — Other Ambulatory Visit: Payer: Self-pay

## 2018-12-15 ENCOUNTER — Ambulatory Visit (INDEPENDENT_AMBULATORY_CARE_PROVIDER_SITE_OTHER): Payer: No Typology Code available for payment source | Admitting: Advanced Practice Midwife

## 2018-12-15 ENCOUNTER — Encounter: Payer: Self-pay | Admitting: Advanced Practice Midwife

## 2018-12-15 VITALS — BP 131/88 | HR 76 | Ht 66.0 in | Wt 128.5 lb

## 2018-12-15 DIAGNOSIS — Z01419 Encounter for gynecological examination (general) (routine) without abnormal findings: Secondary | ICD-10-CM

## 2018-12-15 DIAGNOSIS — Z1329 Encounter for screening for other suspected endocrine disorder: Secondary | ICD-10-CM

## 2018-12-15 DIAGNOSIS — Z1321 Encounter for screening for nutritional disorder: Secondary | ICD-10-CM

## 2018-12-15 MED ORDER — NORETHINDRONE 0.35 MG PO TABS: 1.0000 | ORAL_TABLET | Freq: Every day | ORAL | 4 refills | Status: DC

## 2018-12-15 MED FILL — NORLYDA 0.35 MG TABS: 0.35 | 84 days supply | Qty: 84 | Fill #0

## 2018-12-15 NOTE — Progress Notes (Signed)
Jennifer Harris 43 y.o.  Vitals:   12/15/18 1339 12/15/18 1346  BP: (!) 144/93 131/88  Pulse: 75 76     Filed Weights   12/15/18 1339  Weight: 128 lb 8 oz (58.3 kg)    Past Medical History: Past Medical History:  Diagnosis Date  . BV (bacterial vaginosis) 05/29/2013  . IBS (irritable bowel syndrome)   . Other and unspecified ovarian cyst 05/29/2013  . RLQ abdominal pain 05/29/2013  . Vaginal discharge 07/06/2013  . Vitamin D deficiency     Past Surgical History: Past Surgical History:  Procedure Laterality Date  . FLEXIBLE SIGMOIDOSCOPY  2010    Family History: Family History  Problem Relation Age of Onset  . Cancer Mother        ovarian cancer  . COPD Father   . Hypertension Brother   . ADD / ADHD Son   . Stroke Maternal Grandmother     Social History: Social History   Tobacco Use  . Smoking status: Never Smoker  . Smokeless tobacco: Never Used  Substance Use Topics  . Alcohol use: No  . Drug use: No    Allergies: No Known Allergies    Current Outpatient Medications:  .  acetaminophen (TYLENOL) 500 MG tablet, , Disp: , Rfl:  .  fluticasone (FLONASE) 50 MCG/ACT nasal spray, Place 1 spray into both nostrils as needed for allergies or rhinitis., Disp: , Rfl:  .  norethindrone (MICRONOR) 0.35 MG tablet, Take 1 tablet (0.35 mg total) by mouth daily., Disp: 3 Package, Rfl: 4 .  valACYclovir (VALTREX) 1000 MG tablet, TAKE AS DIRECTED, Disp: 90 tablet, Rfl: 3  History of Present Illness: Here for physical.  Last pap 9/18, normal. On Micronoir (d/t ^ BPs), has started having BTB.  Is OK w/that.  Last pa 2018, normal   Review of Systems   Patient denies any headaches, blurred vision, shortness of breath, chest pain, abdominal pain, problems with bowel movements, urination, or intercourse.   Physical Exam: General:  Well developed, well nourished, no acute distress Skin:  Warm and dry Neck:  Midline trachea, normal thyroid Lungs; Clear to auscultation  bilaterally Breast:  No dominant palpable mass, retraction, or nipple discharge Cardiovascular: Regular rate and rhythm Abdomen:  Soft, non tender, no hepatosplenomegaly Pelvic:  External genitalia is normal in appearance. Small area of mild excoriation (felt tender to pt today when wiping)  The vagina is normal in appearance.  The cervix is bulbous.  Uterus is felt to be normal size, shape, and contour.  No adnexal masses or tenderness noted.  Extremities:  No swelling or varicosities noted Psych:  No mood changes.     Impression: normal GYN exam     Plan: Orders Placed This Encounter  Procedures  . Vitamin D (25 hydroxy)  . CBC  . TSH   Pap 2021  Mammogram scheduled for later this week

## 2018-12-16 LAB — CBC
Hematocrit: 37.2 % (ref 34.0–46.6)
Hemoglobin: 12.6 g/dL (ref 11.1–15.9)
MCH: 31.4 pg (ref 26.6–33.0)
MCHC: 33.9 g/dL (ref 31.5–35.7)
MCV: 93 fL (ref 79–97)
Platelets: 253 10*3/uL (ref 150–450)
RBC: 4.01 x10E6/uL (ref 3.77–5.28)
RDW: 11.7 % (ref 11.7–15.4)
WBC: 5.9 10*3/uL (ref 3.4–10.8)

## 2018-12-16 LAB — TSH: TSH: 2.59 u[IU]/mL (ref 0.450–4.500)

## 2018-12-16 LAB — VITAMIN D 25 HYDROXY (VIT D DEFICIENCY, FRACTURES): Vit D, 25-Hydroxy: 13.8 ng/mL — ABNORMAL LOW (ref 30.0–100.0)

## 2018-12-18 ENCOUNTER — Other Ambulatory Visit: Payer: Self-pay | Admitting: Advanced Practice Midwife

## 2018-12-18 MED ORDER — VITAMIN D (ERGOCALCIFEROL) 1.25 MG (50000 UNIT) PO CAPS: 50000.0000 [IU] | ORAL_CAPSULE | ORAL | 0 refills | Status: DC

## 2018-12-18 NOTE — Progress Notes (Signed)
Drisdol 50k u weekly for 8 weeks then Vit D 2k IU daily (recheck at 8 weeks)

## 2018-12-19 ENCOUNTER — Other Ambulatory Visit: Payer: Self-pay

## 2018-12-19 ENCOUNTER — Ambulatory Visit (HOSPITAL_COMMUNITY)
Admission: RE | Admit: 2018-12-19 | Discharge: 2018-12-19 | Disposition: A | Discharge status: Home / Self Care | Payer: No Typology Code available for payment source | Source: Ambulatory Visit | Attending: Obstetrics and Gynecology | Admitting: Obstetrics and Gynecology

## 2018-12-19 DIAGNOSIS — Z1231 Encounter for screening mammogram for malignant neoplasm of breast: Secondary | ICD-10-CM | POA: Insufficient documentation

## 2018-12-19 MED FILL — VIT D2 1.25 MG (50,000 UNIT: 1.25 MG | 56 days supply | Qty: 8 | Fill #0

## 2018-12-22 ENCOUNTER — Other Ambulatory Visit (HOSPITAL_COMMUNITY): Payer: Self-pay | Admitting: Obstetrics and Gynecology

## 2018-12-22 DIAGNOSIS — R928 Other abnormal and inconclusive findings on diagnostic imaging of breast: Secondary | ICD-10-CM

## 2018-12-30 ENCOUNTER — Ambulatory Visit (HOSPITAL_COMMUNITY)
Admission: RE | Admit: 2018-12-30 | Discharge: 2018-12-30 | Disposition: A | Discharge status: Home / Self Care | Payer: No Typology Code available for payment source | Source: Ambulatory Visit | Attending: Obstetrics and Gynecology | Admitting: Obstetrics and Gynecology

## 2018-12-30 ENCOUNTER — Ambulatory Visit (HOSPITAL_COMMUNITY): Admission: RE | Admit: 2018-12-30 | Payer: No Typology Code available for payment source | Source: Ambulatory Visit

## 2018-12-30 ENCOUNTER — Other Ambulatory Visit: Payer: Self-pay

## 2018-12-30 DIAGNOSIS — R928 Other abnormal and inconclusive findings on diagnostic imaging of breast: Secondary | ICD-10-CM | POA: Diagnosis present

## 2019-01-06 ENCOUNTER — Encounter (HOSPITAL_COMMUNITY): Payer: No Typology Code available for payment source

## 2019-01-06 ENCOUNTER — Other Ambulatory Visit (HOSPITAL_COMMUNITY): Payer: No Typology Code available for payment source

## 2019-01-23 ENCOUNTER — Other Ambulatory Visit: Payer: Self-pay

## 2019-01-23 ENCOUNTER — Encounter: Payer: Self-pay | Admitting: Nurse Practitioner

## 2019-01-23 ENCOUNTER — Ambulatory Visit (INDEPENDENT_AMBULATORY_CARE_PROVIDER_SITE_OTHER): Payer: No Typology Code available for payment source | Admitting: Nurse Practitioner

## 2019-01-23 VITALS — BP 138/80 | Ht 66.0 in | Wt 130.0 lb

## 2019-01-23 DIAGNOSIS — L603 Nail dystrophy: Secondary | ICD-10-CM

## 2019-01-23 NOTE — Patient Instructions (Addendum)
-  Make sure to cut nail bed to smallest size -Find OTC Undecylenic Acid-find highest strength (Fungi Nail ) -Try using Tea-Tree Oil (inside the nail and around the nail) -Can try Penlac

## 2019-01-23 NOTE — Progress Notes (Signed)
   Subjective:    Patient ID: Jennifer Harris, female    DOB: Aug 31, 1975, 43 y.o.   MRN: [id number]  HPIfungus on right great toe nail.     Review of Systems     Objective:   Physical Exam        Assessment & Plan:

## 2019-01-23 NOTE — Progress Notes (Signed)
   Subjective:    Patient ID: Jennifer Harris, female    DOB: 12/09/1975, 43 y.o.   MRN: [id number]  HPI fungus on left great toe nail. Pt stated it started a couple of weeks ago. Nail has some discoloration. No redness or tenderness. Denies fever and chills. No history of trauma to the nail.    Review of Systems See HPI   Objective:   Physical Exam General: Alert and oriented. Well-appearing woman. Cheerful affect. Cardiovascular: Regular rate and rhythm. No murmurs. Respiratory: Normal lung sounds throughout all lung fields. No wheezing or rhonchi. Foot: Left great toe: Separation of nail from the nailbed halfway from the cuticle. Distinct ridge in middle of nail with new growth starting at the nailbed. Slight yellow discoloration of the nail at the distal aspect.       Assessment & Plan:   Problem List Items Addressed This Visit    None    Visit Diagnoses    Onychodystrophy    -  Primary     -Provided information in AVS about OTC medications to help treat nail infection. Instructed pt to: -Make sure to cut nail to smallest size to promote growth of new nail -Find OTC Undecylenic Acid-find highest strength (Fungi Nail ) -Try using Tea-Tree Oil (inside the nail and around the nail) -Can try prescription Penlac if no improvement after a couple of months.   -If symptoms do not improve or worsening, please contact provider. Treatment can take several weeks to months.  Return if symptoms worsen or fail to improve.

## 2019-03-09 MED FILL — NORLYDA 0.35 MG TABS: 0.35 | 84 days supply | Qty: 84 | Fill #1

## 2019-06-01 MED FILL — NORLYDA 0.35 MG TABS: 0.35 | 84 days supply | Qty: 84 | Fill #2

## 2019-06-04 ENCOUNTER — Ambulatory Visit (INDEPENDENT_AMBULATORY_CARE_PROVIDER_SITE_OTHER): Payer: No Typology Code available for payment source | Admitting: Family Medicine

## 2019-06-04 ENCOUNTER — Encounter: Payer: Self-pay | Admitting: Family Medicine

## 2019-06-04 ENCOUNTER — Other Ambulatory Visit: Payer: Self-pay

## 2019-06-04 VITALS — BP 118/76 | Temp 97.9°F | Ht 66.0 in | Wt 130.0 lb

## 2019-06-04 DIAGNOSIS — H6120 Impacted cerumen, unspecified ear: Secondary | ICD-10-CM | POA: Diagnosis not present

## 2019-06-04 MED ORDER — NEOMYCIN-POLYMYXIN-HC 3.5-10000-1 OT SOLN: OTIC | 0 refills | Status: DC

## 2019-06-04 NOTE — Progress Notes (Signed)
   Subjective:    Patient ID: Jennifer Harris, female    DOB: 11-10-1975, 44 y.o.   MRN: [id number]  Otalgia  There is pain in the left ear. This is a new problem. Episode onset: 4 days. Treatments tried: benadryl.    Ear feeling clogged  Then painful    Going on for the pas t week  No meds   Review of Systems  HENT: Positive for ear pain.        Objective:   Physical Exam  Alert vitals stable, NAD. Blood pressure good on repeat. HEENT normal. Lungs clear. Heart regular rate and rhythm. Element of external otitis noted.  Impressive cerumen impaction.  Left ear      Assessment & Plan:  Impression external otitis plus cerumen impaction.  Cortisporin otic suspension drops prescribed.  ENT referral for cerumen removal

## 2019-08-28 MED FILL — NORLYDA 0.35 MG TABS: 0.35 | 84 days supply | Qty: 84 | Fill #3

## 2019-11-17 MED FILL — NORLYDA 0.35 MG TABS: 0.35 | 84 days supply | Qty: 84 | Fill #4

## 2019-12-17 ENCOUNTER — Encounter: Payer: Self-pay | Admitting: Advanced Practice Midwife

## 2019-12-17 ENCOUNTER — Other Ambulatory Visit: Payer: Self-pay | Admitting: Advanced Practice Midwife

## 2019-12-17 ENCOUNTER — Other Ambulatory Visit (HOSPITAL_COMMUNITY)
Admission: RE | Admit: 2019-12-17 | Discharge: 2019-12-17 | Disposition: A | Discharge status: Home / Self Care | Payer: No Typology Code available for payment source | Source: Ambulatory Visit | Attending: Advanced Practice Midwife | Admitting: Advanced Practice Midwife

## 2019-12-17 ENCOUNTER — Ambulatory Visit (INDEPENDENT_AMBULATORY_CARE_PROVIDER_SITE_OTHER): Payer: No Typology Code available for payment source | Admitting: Advanced Practice Midwife

## 2019-12-17 VITALS — BP 145/89 | HR 75 | Ht 66.0 in | Wt 134.0 lb

## 2019-12-17 DIAGNOSIS — Z01419 Encounter for gynecological examination (general) (routine) without abnormal findings: Secondary | ICD-10-CM

## 2019-12-17 DIAGNOSIS — I10 Essential (primary) hypertension: Secondary | ICD-10-CM

## 2019-12-17 DIAGNOSIS — Z13220 Encounter for screening for lipoid disorders: Secondary | ICD-10-CM | POA: Diagnosis not present

## 2019-12-17 DIAGNOSIS — R7989 Other specified abnormal findings of blood chemistry: Secondary | ICD-10-CM

## 2019-12-17 DIAGNOSIS — Z131 Encounter for screening for diabetes mellitus: Secondary | ICD-10-CM

## 2019-12-17 MED ORDER — NORETHINDRONE 0.35 MG PO TABS: 1.0000 | ORAL_TABLET | Freq: Every day | ORAL | 4 refills | Status: DC

## 2019-12-17 MED ORDER — LISINOPRIL 10 MG PO TABS: 10.0000 mg | ORAL_TABLET | Freq: Every day | ORAL | 4 refills | Status: DC

## 2019-12-17 MED FILL — LISINOPRIL 10 MG TABS: 10 | 90 days supply | Qty: 90 | Fill #0

## 2019-12-17 NOTE — Progress Notes (Signed)
Jennifer Harris 44 y.o.  Vitals:   12/17/19 1549  BP: (!) 145/89  Pulse: 75     Filed Weights   12/17/19 1549  Weight: 134 lb (60.8 kg)    Past Medical History: Past Medical History:  Diagnosis Date  . BV (bacterial vaginosis) 05/29/2013  . IBS (irritable bowel syndrome)   . Other and unspecified ovarian cyst 05/29/2013  . RLQ abdominal pain 05/29/2013  . Vaginal discharge 07/06/2013  . Vitamin D deficiency     Past Surgical History: Past Surgical History:  Procedure Laterality Date  . FLEXIBLE SIGMOIDOSCOPY  2010    Family History: Family History  Problem Relation Age of Onset  . Cancer Mother        ovarian cancer  . COPD Father   . Hypertension Brother   . ADD / ADHD Son   . Stroke Maternal Grandmother     Social History: Social History   Tobacco Use  . Smoking status: Never Smoker  . Smokeless tobacco: Never Used  Vaping Use  . Vaping Use: Never used  Substance Use Topics  . Alcohol use: No  . Drug use: No    Allergies: No Known Allergies    Current Outpatient Medications:  .  acetaminophen (TYLENOL) 500 MG tablet, , Disp: , Rfl:  .  fluticasone (FLONASE) 50 MCG/ACT nasal spray, Place 1 spray into both nostrils as needed for allergies or rhinitis., Disp: , Rfl:  .  norethindrone (MICRONOR) 0.35 MG tablet, Take 1 tablet (0.35 mg total) by mouth daily., Disp: 90 tablet, Rfl: 4 .  valACYclovir (VALTREX) 1000 MG tablet, TAKE AS DIRECTED, Disp: 90 tablet, Rfl: 3 .  neomycin-polymyxin-hydrocortisone (CORTISPORIN) OTIC solution, 3 - 4 drops to left ear tid (Patient not taking: Reported on 12/17/2019), Disp: 10 mL, Rfl: 0  History of Present Illness: here for pap and physical. Last pap 2018, normal.  On micronoir.  rarely bleeds. Low Vit D last year, on supplements for a while, stopped.     Review of Systems   Patient denies any headaches, blurred vision, shortness of breath, chest pain, abdominal pain, problems with bowel movements, urination, or  intercourse.   Physical Exam: General:  Well developed, well nourished, no acute distress Skin:  Warm and dry Neck:  Midline trachea, normal thyroid Lungs; Clear to auscultation bilaterally Breast:  No dominant palpable mass, retraction, or nipple discharge Cardiovascular: Regular rate and rhythm Abdomen:  Soft, non tender, no hepatosplenomegaly Pelvic:  External genitalia is normal in appearance.  The vagina is normal in appearance.  The cervix is bulbous.  Uterus is felt to be normal size, shape, and contour.  No adnexal masses or tenderness noted.  Extremities:  No swelling or varicosities noted Psych:  No mood changes.     Impression: CHTN, new dx Normal GYN exam     Plan: if pap normal, repeat q 3 years Start lisinopril 10mg --f/u with mychart in 2 weeks with BP readings (can take at work) Orders Placed This Encounter  Procedures  . CBC  . Comprehensive metabolic panel    Order Specific Question:   Has the patient fasted?    Answer:   Yes  . Hemoglobin A1c  . VITAMIN D 25 Hydroxy (Vit-D Deficiency, Fractures)  . Lipid panel    Order Specific Question:   Has the patient fasted?    Answer:   Yes  . TSH

## 2019-12-22 LAB — CYTOLOGY - PAP
Comment: NEGATIVE
Diagnosis: UNDETERMINED — AB
High risk HPV: NEGATIVE

## 2020-01-07 ENCOUNTER — Other Ambulatory Visit: Payer: Self-pay | Admitting: Adult Health

## 2020-01-08 ENCOUNTER — Other Ambulatory Visit: Payer: Self-pay | Admitting: Adult Health

## 2020-01-08 MED ORDER — LOSARTAN POTASSIUM 50 MG PO TABS: 50.0000 mg | ORAL_TABLET | Freq: Every day | ORAL | 1 refills | Status: DC

## 2020-01-08 MED FILL — LOSARTAN POTASSIUM 50 MG TA: 50 | 90 days supply | Qty: 90 | Fill #0

## 2020-01-08 NOTE — Progress Notes (Signed)
Had cough with lisinopril sot stop that will rx losartan

## 2020-02-09 ENCOUNTER — Other Ambulatory Visit (HOSPITAL_COMMUNITY): Payer: Self-pay | Admitting: Advanced Practice Midwife

## 2020-02-09 DIAGNOSIS — Z1231 Encounter for screening mammogram for malignant neoplasm of breast: Secondary | ICD-10-CM

## 2020-02-09 MED FILL — NORLYDA 0.35 MG TABS: 0.35 | 84 days supply | Qty: 84 | Fill #0

## 2020-03-04 ENCOUNTER — Other Ambulatory Visit: Payer: Self-pay

## 2020-03-04 ENCOUNTER — Ambulatory Visit (INDEPENDENT_AMBULATORY_CARE_PROVIDER_SITE_OTHER): Payer: No Typology Code available for payment source | Admitting: Family Medicine

## 2020-03-04 ENCOUNTER — Ambulatory Visit (HOSPITAL_COMMUNITY)
Admission: RE | Admit: 2020-03-04 | Discharge: 2020-03-04 | Disposition: A | Discharge status: Home / Self Care | Payer: No Typology Code available for payment source | Source: Ambulatory Visit | Attending: Advanced Practice Midwife | Admitting: Advanced Practice Midwife

## 2020-03-04 ENCOUNTER — Encounter: Payer: Self-pay | Admitting: Family Medicine

## 2020-03-04 VITALS — BP 118/82 | HR 85 | Temp 97.7°F | Wt 131.8 lb

## 2020-03-04 DIAGNOSIS — Z1231 Encounter for screening mammogram for malignant neoplasm of breast: Secondary | ICD-10-CM | POA: Diagnosis present

## 2020-03-04 DIAGNOSIS — B369 Superficial mycosis, unspecified: Secondary | ICD-10-CM | POA: Diagnosis not present

## 2020-03-04 MED ORDER — CLOTRIMAZOLE 1 % EX CREA: 1.0000 "application " | TOPICAL_CREAM | Freq: Two times a day (BID) | CUTANEOUS | 1 refills | Status: DC

## 2020-03-04 NOTE — Progress Notes (Signed)
Patient ID: Jennifer Harris, female    DOB: 05-06-1975, 44 y.o.   MRN: [id number]   Chief Complaint  Patient presents with  . Rash    Patient complains of rash on chest for a couple of weeks, no itching or irritation. Has tried otc eczema cream, hydrocortisone cream and her sons prescription eczema cream with no improvement.    Subjective:  CC: rash on upper chest area  This is a new problem.  Reports today with a complaint of rash on her chest.  Initially it was pruritic but not now.  Rash appeared 2 weeks ago.  Seems to be worse with heat, reports that she recently changed from an ACE inhibitor to losartan, and this was approximately 3 weeks ago.  She has tried eczema cream, hydrocortisone cream and a prescription medication for eczema that her son has.  No improvement.  Denies fever, chills, no itching currently.  Denies any recent changes in soaps, lotions, she did eliminate creams and perfumes on her own to see.  With no improvement.    Medical History Jennifer Harris has a past medical history of BV (bacterial vaginosis) (05/29/2013), IBS (irritable bowel syndrome), Other and unspecified ovarian cyst (05/29/2013), RLQ abdominal pain (05/29/2013), Vaginal discharge (07/06/2013), and Vitamin D deficiency.   Outpatient Encounter Medications as of 03/04/2020  Medication Sig  . acetaminophen (TYLENOL) 500 MG tablet   . clotrimazole (CLOTRIMAZOLE ANTI-FUNGAL) 1 % cream Apply 1 application topically 2 (two) times daily.  . fluticasone (FLONASE) 50 MCG/ACT nasal spray Place 1 spray into both nostrils as needed for allergies or rhinitis.  Marland Kitchen losartan (COZAAR) 50 MG tablet Take 1 tablet (50 mg total) by mouth daily.  . norethindrone (MICRONOR) 0.35 MG tablet Take 1 tablet (0.35 mg total) by mouth daily.  . valACYclovir (VALTREX) 1000 MG tablet TAKE AS DIRECTED  . [DISCONTINUED] neomycin-polymyxin-hydrocortisone (CORTISPORIN) OTIC solution 3 - 4 drops to left ear tid (Patient not taking: Reported on  12/17/2019)   No facility-administered encounter medications on file as of 03/04/2020.     Review of Systems  Constitutional: Negative for chills and fever.  HENT: Negative for ear pain.   Respiratory: Negative for shortness of breath.   Cardiovascular: Negative for chest pain.  Gastrointestinal: Negative for abdominal pain.  Skin: Positive for rash.     Vitals BP 118/82   Pulse 85   Temp 97.7 F (36.5 C)   Wt 131 lb 12.8 oz (59.8 kg)   SpO2 97%   BMI 21.27 kg/m   Objective:   Physical Exam Constitutional:      Appearance: Normal appearance.  Cardiovascular:     Rate and Rhythm: Normal rate and regular rhythm.     Heart sounds: Normal heart sounds.  Pulmonary:     Effort: Pulmonary effort is normal.     Breath sounds: Normal breath sounds.  Skin:    General: Skin is warm and dry.     Findings: Rash present.     Comments: Upper chest erythematous, slightly raised rash.   Neurological:     General: No focal deficit present.     Mental Status: She is alert.  Psychiatric:        Behavior: Behavior normal.      Assessment and Plan   1. Fungal skin infection - clotrimazole (CLOTRIMAZOLE ANTI-FUNGAL) 1 % cream; Apply 1 application topically 2 (two) times daily.  Dispense: 30 g; Refill: 1    We will treat for fungal skin infection, she will let me  know if this is not effective, and will consider a Derm referral at that time.  Agrees with plan of care discussed today. Understands warning signs to seek further care: Chest pain, shortness of breath, any significant change.  She will let me know if the rash changes, spreads, becomes more itchy, drains. Understands to follow-up in 1 month, if it is completely better she can cancel this appointment.  If not improved will consider dermatology referral.  Dorena Bodo, FNP-C

## 2020-04-04 ENCOUNTER — Ambulatory Visit: Payer: No Typology Code available for payment source | Admitting: Family Medicine

## 2020-04-04 MED FILL — LOSARTAN POTASSIUM 50 MG TA: 50 | 30 days supply | Qty: 30 | Fill #1

## 2020-04-08 ENCOUNTER — Encounter: Payer: Self-pay | Admitting: Family Medicine

## 2020-04-08 ENCOUNTER — Other Ambulatory Visit: Payer: Self-pay

## 2020-04-08 ENCOUNTER — Telehealth (INDEPENDENT_AMBULATORY_CARE_PROVIDER_SITE_OTHER): Payer: No Typology Code available for payment source | Admitting: Family Medicine

## 2020-04-08 DIAGNOSIS — R21 Rash and other nonspecific skin eruption: Secondary | ICD-10-CM

## 2020-04-08 NOTE — Progress Notes (Signed)
Patient ID: Jennifer Harris, female    DOB: 31-Jan-1976, 45 y.o.   MRN: [id number]  Virtual Visit via Telephone Note  I connected with Jennifer Harris on 04/08/20 at  1:10 PM EST by telephone and verified that I am speaking with the correct person using two identifiers.  Location: Patient: office Provider: home   I discussed the limitations, risks, security and privacy concerns of performing an evaluation and management service by telephone and the availability of in person appointments. I also discussed with the patient that there may be a patient responsible charge related to this service. The patient expressed understanding and agreed to proceed.   History of Present Illness:    Observations/Objective:   Assessment and Plan:   Follow Up Instructions:    I discussed the assessment and treatment plan with the patient. The patient was provided an opportunity to ask questions and all were answered. The patient agreed with the plan and demonstrated an understanding of the instructions.   The patient was advised to call back or seek an in-person evaluation if the symptoms worsen or if the condition fails to improve as anticipated.  I provided 14 minutes of non-face-to-face time during this encounter.   Novella Olive, NP   Chief Complaint  Patient presents with  . Follow-up    Patient following up on skin infection since starting clotrimazole cream. Patient reports no improvement with clotrimazole cream- she stopped it and started using hydrocortisone cream which has made some improvement.    Subjective:  CC: follow-up for skin infection  This is a new problem.  Presents today via telephone visit to follow-up for fungal skin infection that was diagnosed on December 10.  Reports that that infection has now resolved, but she has had these red bumps on her neck/esophagus area for years and they are worsening now.  These bumps are not itchy, wishes to have a dermatology  referral.  Denies fever, chills, no shortness of breath.    Medical History Jennifer Harris has a past medical history of BV (bacterial vaginosis) (05/29/2013), IBS (irritable bowel syndrome), Other and unspecified ovarian cyst (05/29/2013), RLQ abdominal pain (05/29/2013), Vaginal discharge (07/06/2013), and Vitamin D deficiency.   Outpatient Encounter Medications as of 04/08/2020  Medication Sig  . acetaminophen (TYLENOL) 500 MG tablet   . losartan (COZAAR) 50 MG tablet Take 1 tablet (50 mg total) by mouth daily.  . norethindrone (MICRONOR) 0.35 MG tablet Take 1 tablet (0.35 mg total) by mouth daily.  . valACYclovir (VALTREX) 1000 MG tablet TAKE AS DIRECTED  . [DISCONTINUED] clotrimazole (CLOTRIMAZOLE ANTI-FUNGAL) 1 % cream Apply 1 application topically 2 (two) times daily.  . [DISCONTINUED] fluticasone (FLONASE) 50 MCG/ACT nasal spray Place 1 spray into both nostrils as needed for allergies or rhinitis.   No facility-administered encounter medications on file as of 04/08/2020.     Review of Systems  Constitutional: Negative for chills and fever.  Respiratory: Negative for shortness of breath.   Cardiovascular: Negative for chest pain.  Skin: Positive for rash.       Reports red bumps present on esophagus area for years.  Wishes to have dermatology referral.     Vitals There were no vitals taken for this visit.  Objective:   Physical Exam   Assessment and Plan   1. Skin rash - Ambulatory referral to Dermatology   Initially seen December 10 diagnosed with fungal skin infection, medication prescribed this rash has resolved.  Presents today for an issue this been  going on for years, wishes for dermatology referral.  Unable to visualize rash today due to being a phone visit.  Agrees with plan of care discussed today. Understands warning signs to seek further care: Chest pain, shortness of breath, any significant change in health. Understands to follow-up with this office as needed, will  make dermatology referral today.  Dorena Bodo, FNP-C

## 2020-05-03 MED FILL — NORLYDA 0.35 MG TABS: 0.35 | 84 days supply | Qty: 84 | Fill #1

## 2020-05-03 MED FILL — LOSARTAN POTASSIUM 50 MG TA: 50 | 30 days supply | Qty: 30 | Fill #2

## 2020-05-16 ENCOUNTER — Encounter: Payer: Self-pay | Admitting: Family Medicine

## 2020-06-25 ENCOUNTER — Other Ambulatory Visit (HOSPITAL_COMMUNITY): Payer: Self-pay

## 2020-07-02 ENCOUNTER — Other Ambulatory Visit: Payer: Self-pay | Admitting: Adult Health

## 2020-07-04 ENCOUNTER — Other Ambulatory Visit (HOSPITAL_COMMUNITY): Payer: Self-pay

## 2020-07-04 MED ORDER — LOSARTAN POTASSIUM 50 MG PO TABS
50.0000 mg | ORAL_TABLET | Freq: Every day | ORAL | 1 refills | Status: DC
  Filled 2020-07-04: qty 90, 90d supply, fill #0
  Filled 2020-10-02: qty 90, 90d supply, fill #1

## 2020-07-05 ENCOUNTER — Other Ambulatory Visit (HOSPITAL_COMMUNITY): Payer: Self-pay

## 2020-07-26 ENCOUNTER — Other Ambulatory Visit (HOSPITAL_COMMUNITY): Payer: Self-pay

## 2020-07-26 MED FILL — Norethindrone Tab 0.35 MG: ORAL | 84 days supply | Qty: 84 | Fill #0 | Status: AC

## 2020-08-11 ENCOUNTER — Other Ambulatory Visit (HOSPITAL_COMMUNITY): Payer: Self-pay

## 2020-08-11 MED ORDER — AMOXICILLIN 500 MG PO CAPS
500.0000 mg | ORAL_CAPSULE | Freq: Three times a day (TID) | ORAL | 0 refills | Status: DC
  Filled 2020-08-11: qty 30, 10d supply, fill #0

## 2020-08-12 ENCOUNTER — Other Ambulatory Visit (HOSPITAL_COMMUNITY): Payer: Self-pay

## 2020-08-12 MED ORDER — ONDANSETRON 8 MG PO TBDP
8.0000 mg | ORAL_TABLET | Freq: Three times a day (TID) | ORAL | 0 refills | Status: AC | PRN
  Filled 2020-08-12: qty 16, 6d supply, fill #0

## 2020-10-03 ENCOUNTER — Other Ambulatory Visit (HOSPITAL_COMMUNITY): Payer: Self-pay

## 2020-10-13 ENCOUNTER — Telehealth: Payer: Self-pay | Admitting: Family Medicine

## 2020-10-13 DIAGNOSIS — Z79899 Other long term (current) drug therapy: Secondary | ICD-10-CM

## 2020-10-13 DIAGNOSIS — Z0000 Encounter for general adult medical examination without abnormal findings: Secondary | ICD-10-CM

## 2020-10-13 DIAGNOSIS — Z1329 Encounter for screening for other suspected endocrine disorder: Secondary | ICD-10-CM

## 2020-10-13 DIAGNOSIS — I10 Essential (primary) hypertension: Secondary | ICD-10-CM

## 2020-10-13 DIAGNOSIS — Z13220 Encounter for screening for lipoid disorders: Secondary | ICD-10-CM

## 2020-10-13 NOTE — Telephone Encounter (Signed)
Pt has active labs with Fran.last labs completed by [place] [facility] 04/16/2018. Please advise. Thank you

## 2020-10-13 NOTE — Telephone Encounter (Signed)
Patient has physical on 9/30 and needing labs done

## 2020-10-17 NOTE — Telephone Encounter (Signed)
CBC, CMP, Lipid and TSH please. Insurance is not covering vitamin D levels for most patients. Thanks

## 2020-10-18 NOTE — Telephone Encounter (Signed)
Lab orders placed. Left message to return call  

## 2020-10-18 NOTE — Telephone Encounter (Signed)
Pt returned call and verbalized understanding  

## 2020-10-19 ENCOUNTER — Other Ambulatory Visit (HOSPITAL_COMMUNITY): Payer: Self-pay

## 2020-10-19 MED FILL — Norethindrone Tab 0.35 MG: ORAL | 84 days supply | Qty: 84 | Fill #1 | Status: AC

## 2020-10-20 ENCOUNTER — Other Ambulatory Visit (HOSPITAL_COMMUNITY): Payer: Self-pay

## 2020-12-10 LAB — CBC WITH DIFFERENTIAL/PLATELET
Basophils Absolute: 0 10*3/uL (ref 0.0–0.2)
Basos: 1 %
EOS (ABSOLUTE): 0 10*3/uL (ref 0.0–0.4)
Eos: 0 %
Hematocrit: 38.2 % (ref 34.0–46.6)
Hemoglobin: 12.9 g/dL (ref 11.1–15.9)
Immature Grans (Abs): 0 10*3/uL (ref 0.0–0.1)
Immature Granulocytes: 0 %
Lymphocytes Absolute: 2.2 10*3/uL (ref 0.7–3.1)
Lymphs: 31 %
MCH: 31 pg (ref 26.6–33.0)
MCHC: 33.8 g/dL (ref 31.5–35.7)
MCV: 92 fL (ref 79–97)
Monocytes Absolute: 0.5 10*3/uL (ref 0.1–0.9)
Monocytes: 6 %
Neutrophils Absolute: 4.4 10*3/uL (ref 1.4–7.0)
Neutrophils: 62 %
Platelets: 281 10*3/uL (ref 150–450)
RBC: 4.16 x10E6/uL (ref 3.77–5.28)
RDW: 11.9 % (ref 11.7–15.4)
WBC: 7.1 10*3/uL (ref 3.4–10.8)

## 2020-12-10 LAB — COMPREHENSIVE METABOLIC PANEL
ALT: 12 IU/L (ref 0–32)
AST: 18 IU/L (ref 0–40)
Albumin/Globulin Ratio: 1.9 (ref 1.2–2.2)
Albumin: 5 g/dL — ABNORMAL HIGH (ref 3.8–4.8)
Alkaline Phosphatase: 109 IU/L (ref 44–121)
BUN/Creatinine Ratio: 18 (ref 9–23)
BUN: 11 mg/dL (ref 6–24)
Bilirubin Total: 0.5 mg/dL (ref 0.0–1.2)
CO2: 22 mmol/L (ref 20–29)
Calcium: 9.2 mg/dL (ref 8.7–10.2)
Chloride: 100 mmol/L (ref 96–106)
Creatinine, Ser: 0.62 mg/dL (ref 0.57–1.00)
Globulin, Total: 2.6 g/dL (ref 1.5–4.5)
Glucose: 85 mg/dL (ref 65–99)
Potassium: 4.2 mmol/L (ref 3.5–5.2)
Sodium: 139 mmol/L (ref 134–144)
Total Protein: 7.6 g/dL (ref 6.0–8.5)
eGFR: 112 mL/min/{1.73_m2} (ref >59)

## 2020-12-10 LAB — LIPID PANEL
Chol/HDL Ratio: 2.9 ratio (ref 0.0–4.4)
Cholesterol, Total: 197 mg/dL (ref 100–199)
HDL: 67 mg/dL (ref >39)
LDL Chol Calc (NIH): 120 mg/dL — ABNORMAL HIGH (ref 0–99)
Triglycerides: 52 mg/dL (ref 0–149)
VLDL Cholesterol Cal: 10 mg/dL (ref 5–40)

## 2020-12-10 LAB — TSH: TSH: 2.27 u[IU]/mL (ref 0.450–4.500)

## 2020-12-15 ENCOUNTER — Other Ambulatory Visit (HOSPITAL_COMMUNITY): Payer: Self-pay

## 2020-12-23 ENCOUNTER — Encounter: Payer: Self-pay | Admitting: Nurse Practitioner

## 2020-12-23 ENCOUNTER — Other Ambulatory Visit: Payer: Self-pay

## 2020-12-23 ENCOUNTER — Other Ambulatory Visit (HOSPITAL_COMMUNITY): Payer: Self-pay

## 2020-12-23 ENCOUNTER — Ambulatory Visit (INDEPENDENT_AMBULATORY_CARE_PROVIDER_SITE_OTHER): Payer: No Typology Code available for payment source | Admitting: Nurse Practitioner

## 2020-12-23 VITALS — BP 117/79 | Ht 66.0 in | Wt 139.4 lb

## 2020-12-23 DIAGNOSIS — Z0000 Encounter for general adult medical examination without abnormal findings: Secondary | ICD-10-CM

## 2020-12-23 DIAGNOSIS — I10 Essential (primary) hypertension: Secondary | ICD-10-CM

## 2020-12-23 MED ORDER — LOSARTAN POTASSIUM 50 MG PO TABS
50.0000 mg | ORAL_TABLET | Freq: Every day | ORAL | 1 refills | Status: DC
  Filled 2020-12-23: qty 90, 90d supply, fill #0
  Filled 2021-03-30: qty 90, 90d supply, fill #1

## 2020-12-23 NOTE — Progress Notes (Signed)
Subjective:    Patient ID: Jennifer Harris, female    DOB: 16-Feb-1976, 45 y.o.   MRN: [id number]  HPI The patient comes in today for a wellness visit.    A review of their health history was completed.  A review of medications was also completed.  Any needed refills; no  Eating habits: trying to eat healthy  Falls/  MVA accidents in past few months: no  Regular exercise: no  Specialist pt sees on regular basis: no  Preventative health issues were discussed.   Additional concerns: no  Gets female physicals with GYN.  Regular vision and dental exams.   Review of Systems  Constitutional:  Negative for activity change, appetite change and fatigue.  Respiratory:  Negative for cough, chest tightness, shortness of breath and wheezing.   Cardiovascular:  Negative for chest pain.  Gastrointestinal:  Negative for abdominal distention, abdominal pain, constipation, diarrhea, nausea and vomiting.  Genitourinary:  Negative for difficulty urinating, dysuria, enuresis, frequency and urgency.  Depression screen [facility] 2/9 12/23/2020  Decreased Interest 0  Down, Depressed, Hopeless 0  PHQ - 2 Score 0  Altered sleeping -  Tired, decreased energy -  Change in appetite -  Feeling bad or failure about yourself  -  Trouble concentrating -  Moving slowly or fidgety/restless -  Suicidal thoughts -  PHQ-9 Score -       Objective:   Physical Exam Constitutional:      General: She is not in acute distress.    Appearance: She is well-developed.  Neck:     Thyroid: No thyromegaly.     Trachea: No tracheal deviation.     Comments: Thyroid non tender to palpation. No mass or goiter noted.  Cardiovascular:     Rate and Rhythm: Normal rate and regular rhythm.     Heart sounds: Normal heart sounds. No murmur heard. Pulmonary:     Effort: Pulmonary effort is normal.     Breath sounds: Normal breath sounds.  Abdominal:     General: There is no distension.     Palpations: Abdomen is  soft.     Tenderness: There is no abdominal tenderness.  Musculoskeletal:     Cervical back: Normal range of motion and neck supple.  Lymphadenopathy:     Cervical: No cervical adenopathy.  Skin:    General: Skin is warm and dry.     Findings: No rash.  Neurological:     Mental Status: She is alert and oriented to person, place, and time.  Psychiatric:        Mood and Affect: Mood normal.        Behavior: Behavior normal.        Thought Content: Thought content normal.        Judgment: Judgment normal.  Today's Vitals   12/23/20 1429  BP: 117/79  Weight: 139 lb 6.4 oz (63.2 kg)  Height: 5' 6" (1.676 m)   Body mass index is 22.5 kg/m.  Results for orders placed or performed in visit on 10/13/20  CBC with Differential  Result Value Ref Range   WBC 7.1 3.4 - 10.8 x10E3/uL   RBC 4.16 3.77 - 5.28 x10E6/uL   Hemoglobin 12.9 11.1 - 15.9 g/dL   Hematocrit 38.2 34.0 - 46.6 %   MCV 92 79 - 97 fL   MCH 31.0 26.6 - 33.0 pg   MCHC 33.8 31.5 - 35.7 g/dL   RDW 11.9 11.7 - 15.4 %   Platelets 281 150 -  450 x10E3/uL   Neutrophils 62 Not Estab. %   Lymphs 31 Not Estab. %   Monocytes 6 Not Estab. %   Eos 0 Not Estab. %   Basos 1 Not Estab. %   Neutrophils Absolute 4.4 1.4 - 7.0 x10E3/uL   Lymphocytes Absolute 2.2 0.7 - 3.1 x10E3/uL   Monocytes Absolute 0.5 0.1 - 0.9 x10E3/uL   EOS (ABSOLUTE) 0.0 0.0 - 0.4 x10E3/uL   Basophils Absolute 0.0 0.0 - 0.2 x10E3/uL   Immature Granulocytes 0 Not Estab. %   Immature Grans (Abs) 0.0 0.0 - 0.1 x10E3/uL  Comprehensive Metabolic Panel (CMET)  Result Value Ref Range   Glucose 85 65 - 99 mg/dL   BUN 11 6 - 24 mg/dL   Creatinine, Ser 0.62 0.57 - 1.00 mg/dL   eGFR 112 >59 mL/min/1.73   BUN/Creatinine Ratio 18 9 - 23   Sodium 139 134 - 144 mmol/L   Potassium 4.2 3.5 - 5.2 mmol/L   Chloride 100 96 - 106 mmol/L   CO2 22 20 - 29 mmol/L   Calcium 9.2 8.7 - 10.2 mg/dL   Total Protein 7.6 6.0 - 8.5 g/dL   Albumin 5.0 (H) 3.8 - 4.8 g/dL   Globulin,  Total 2.6 1.5 - 4.5 g/dL   Albumin/Globulin Ratio 1.9 1.2 - 2.2   Bilirubin Total 0.5 0.0 - 1.2 mg/dL   Alkaline Phosphatase 109 44 - 121 IU/L   AST 18 0 - 40 IU/L   ALT 12 0 - 32 IU/L  Lipid Profile  Result Value Ref Range   Cholesterol, Total 197 100 - 199 mg/dL   Triglycerides 52 0 - 149 mg/dL   HDL 67 >39 mg/dL   VLDL Cholesterol Cal 10 5 - 40 mg/dL   LDL Chol Calc (NIH) 120 (H) 0 - 99 mg/dL   Chol/HDL Ratio 2.9 0.0 - 4.4 ratio  TSH  Result Value Ref Range   TSH 2.270 0.450 - 4.500 uIU/mL          Assessment & Plan:   Problem List Items Addressed This Visit       Cardiovascular and Mediastinum   Essential hypertension   Relevant Medications   losartan (COZAAR) 50 MG tablet   Other Visit Diagnoses     Routine general medical examination at a health care facility    -  Primary      Meds ordered this encounter  Medications   losartan (COZAAR) 50 MG tablet    Sig: Take 1 tablet (50 mg total) by mouth daily.    Dispense:  90 tablet    Refill:  1    Order Specific Question:   Supervising Provider    Answer:   Kathyrn Drown [phone]   Reviewed labs with patient.  Continue current dose of Losartan. Encouraged healthy diet and regular activity. Return in about 6 months (around 06/22/2021) for BP check up.

## 2021-01-02 ENCOUNTER — Other Ambulatory Visit (HOSPITAL_COMMUNITY): Payer: Self-pay

## 2021-01-09 ENCOUNTER — Other Ambulatory Visit: Payer: Self-pay | Admitting: Advanced Practice Midwife

## 2021-01-09 ENCOUNTER — Other Ambulatory Visit (HOSPITAL_COMMUNITY): Payer: Self-pay

## 2021-01-09 MED ORDER — NORETHINDRONE 0.35 MG PO TABS
1.0000 | ORAL_TABLET | Freq: Every day | ORAL | 4 refills | Status: DC
  Filled 2021-01-09: qty 84, 84d supply, fill #0
  Filled 2021-04-02: qty 84, 84d supply, fill #1
  Filled 2021-06-27: qty 84, 84d supply, fill #2
  Filled 2021-09-18: qty 84, 84d supply, fill #3
  Filled 2021-12-10: qty 84, 84d supply, fill #4

## 2021-01-10 ENCOUNTER — Other Ambulatory Visit (HOSPITAL_COMMUNITY): Payer: Self-pay

## 2021-01-19 ENCOUNTER — Other Ambulatory Visit (HOSPITAL_COMMUNITY): Payer: Self-pay | Admitting: Advanced Practice Midwife

## 2021-01-19 DIAGNOSIS — Z1231 Encounter for screening mammogram for malignant neoplasm of breast: Secondary | ICD-10-CM

## 2021-03-10 ENCOUNTER — Other Ambulatory Visit: Payer: Self-pay

## 2021-03-10 ENCOUNTER — Ambulatory Visit (HOSPITAL_COMMUNITY)
Admission: RE | Admit: 2021-03-10 | Discharge: 2021-03-10 | Disposition: A | Discharge status: Home / Self Care | Payer: No Typology Code available for payment source | Source: Ambulatory Visit | Attending: Advanced Practice Midwife | Admitting: Advanced Practice Midwife

## 2021-03-10 DIAGNOSIS — Z1231 Encounter for screening mammogram for malignant neoplasm of breast: Secondary | ICD-10-CM | POA: Diagnosis present

## 2021-03-13 ENCOUNTER — Other Ambulatory Visit (HOSPITAL_COMMUNITY): Payer: Self-pay | Admitting: Advanced Practice Midwife

## 2021-03-13 DIAGNOSIS — R928 Other abnormal and inconclusive findings on diagnostic imaging of breast: Secondary | ICD-10-CM

## 2021-03-14 ENCOUNTER — Other Ambulatory Visit: Payer: Self-pay

## 2021-03-14 ENCOUNTER — Ambulatory Visit (HOSPITAL_COMMUNITY)
Admission: RE | Admit: 2021-03-14 | Discharge: 2021-03-14 | Disposition: A | Discharge status: Home / Self Care | Payer: No Typology Code available for payment source | Source: Ambulatory Visit | Attending: Advanced Practice Midwife | Admitting: Advanced Practice Midwife

## 2021-03-14 DIAGNOSIS — R928 Other abnormal and inconclusive findings on diagnostic imaging of breast: Secondary | ICD-10-CM | POA: Insufficient documentation

## 2021-03-21 ENCOUNTER — Encounter: Payer: Self-pay | Admitting: *Deleted

## 2021-03-21 ENCOUNTER — Telehealth: Payer: Self-pay | Admitting: Women's Health

## 2021-03-21 ENCOUNTER — Other Ambulatory Visit (HOSPITAL_COMMUNITY): Payer: Self-pay

## 2021-03-21 ENCOUNTER — Other Ambulatory Visit: Payer: Self-pay | Admitting: Women's Health

## 2021-03-21 MED ORDER — FLUCONAZOLE 150 MG PO TABS: 150.0000 mg | ORAL_TABLET | Freq: Once | ORAL | 0 refills | Status: AC

## 2021-03-21 MED ORDER — FLUCONAZOLE 150 MG PO TABS
150.0000 mg | ORAL_TABLET | Freq: Once | ORAL | 0 refills | Status: DC
  Filled 2021-03-21: qty 2, 2d supply, fill #0

## 2021-03-21 NOTE — Telephone Encounter (Signed)
Patient called stating that she has a yeast infection and would like for Kim to call her something in to her pharmacy; [facility].please contact pt whem medication has been sent.

## 2021-03-21 NOTE — Telephone Encounter (Signed)
Spoke with patient. Informed her of rx sent to pharmacy.

## 2021-03-31 ENCOUNTER — Other Ambulatory Visit (HOSPITAL_COMMUNITY): Payer: Self-pay

## 2021-04-03 ENCOUNTER — Other Ambulatory Visit (HOSPITAL_COMMUNITY): Payer: Self-pay

## 2021-06-07 ENCOUNTER — Other Ambulatory Visit (HOSPITAL_COMMUNITY): Payer: Self-pay

## 2021-06-23 ENCOUNTER — Ambulatory Visit: Payer: No Typology Code available for payment source | Admitting: Family Medicine

## 2021-06-28 ENCOUNTER — Other Ambulatory Visit (HOSPITAL_COMMUNITY): Payer: Self-pay

## 2021-07-02 ENCOUNTER — Other Ambulatory Visit: Payer: Self-pay | Admitting: Nurse Practitioner

## 2021-07-03 ENCOUNTER — Other Ambulatory Visit (HOSPITAL_COMMUNITY): Payer: Self-pay

## 2021-07-03 MED ORDER — LOSARTAN POTASSIUM 50 MG PO TABS
50.0000 mg | ORAL_TABLET | Freq: Every day | ORAL | 1 refills | Status: DC
  Filled 2021-07-03: qty 90, 90d supply, fill #0
  Filled 2021-09-18: qty 90, 90d supply, fill #1

## 2021-07-04 ENCOUNTER — Ambulatory Visit (INDEPENDENT_AMBULATORY_CARE_PROVIDER_SITE_OTHER): Payer: No Typology Code available for payment source | Admitting: Nurse Practitioner

## 2021-07-04 ENCOUNTER — Encounter: Payer: Self-pay | Admitting: Nurse Practitioner

## 2021-07-04 VITALS — BP 138/74 | HR 98 | Temp 96.8°F | Wt 136.8 lb

## 2021-07-04 DIAGNOSIS — I10 Essential (primary) hypertension: Secondary | ICD-10-CM

## 2021-07-04 NOTE — Progress Notes (Signed)
? ?  Subjective:  ? ? Patient ID: Jennifer Harris, female    DOB: October 23, 1975, 46 y.o.   MRN: [id number] ? ?HPI ? ?46 year old female patient with history of hypertension reports to clinic today for blood pressure check.  Patient takes losartan 50 mg daily for blood pressure management.  Patient states that she is taking medication without difficulty.  Patient states that she does not take blood pressure at home.  Patient reports no issues at this time.  Patient denies any chest pain, shortness of breath, swelling, difficulty breathing, new onset headaches, changes to vision.   ? ?Review of Systems  ?All other systems reviewed and are negative. ? ?   ?Objective:  ? Physical Exam ?Vitals reviewed.  ?Constitutional:   ?   General: She is not in acute distress. ?   Appearance: Normal appearance. She is normal weight. She is not ill-appearing, toxic-appearing or diaphoretic.  ?HENT:  ?   Head: Normocephalic and atraumatic.  ?Cardiovascular:  ?   Rate and Rhythm: Normal rate and regular rhythm.  ?   Pulses: Normal pulses.  ?   Heart sounds: Normal heart sounds. No murmur heard. ?Pulmonary:  ?   Effort: Pulmonary effort is normal. No respiratory distress.  ?   Breath sounds: Normal breath sounds. No wheezing.  ?Musculoskeletal:  ?   Comments: Grossly intact  ?Skin: ?   General: Skin is warm.  ?   Capillary Refill: Capillary refill takes less than 2 seconds.  ?Neurological:  ?   Mental Status: She is alert.  ?   Comments: Grossly intact  ?Psychiatric:     ?   Mood and Affect: Mood normal.     ?   Behavior: Behavior normal.  ? ? ? ?   ?Assessment & Plan:  ? ?1. Essential hypertension ?-Blood pressure today is 138/74.  Goal of blood pressure less than 140/90 met ?-Continue taking losartan 50 mg as prescribed. ?-Refill of losartan 50 mg sent into the pharmacy yesterday.  180-day supply provided. ?-Return to clinic in 6 months for blood pressure recheck.  If blood pressures remain stable in 6 months we will consider doing blood  pressure checks yearly. ? ?  ?Note:  This document was prepared using Dragon voice recognition software and may include unintentional dictation errors. ? ? ?

## 2021-08-29 ENCOUNTER — Other Ambulatory Visit (HOSPITAL_COMMUNITY): Payer: Self-pay

## 2021-09-19 ENCOUNTER — Other Ambulatory Visit (HOSPITAL_COMMUNITY): Payer: Self-pay

## 2021-11-23 ENCOUNTER — Other Ambulatory Visit (HOSPITAL_COMMUNITY): Payer: Self-pay

## 2021-12-10 ENCOUNTER — Other Ambulatory Visit (HOSPITAL_COMMUNITY): Payer: Self-pay

## 2021-12-11 ENCOUNTER — Other Ambulatory Visit (HOSPITAL_COMMUNITY): Payer: Self-pay

## 2021-12-29 ENCOUNTER — Other Ambulatory Visit (HOSPITAL_COMMUNITY): Payer: Self-pay

## 2021-12-29 ENCOUNTER — Ambulatory Visit (INDEPENDENT_AMBULATORY_CARE_PROVIDER_SITE_OTHER): Payer: No Typology Code available for payment source | Admitting: Nurse Practitioner

## 2021-12-29 VITALS — BP 150/92 | HR 67 | Temp 98.1°F | Wt 134.0 lb

## 2021-12-29 DIAGNOSIS — Z23 Encounter for immunization: Secondary | ICD-10-CM

## 2021-12-29 DIAGNOSIS — Z79899 Other long term (current) drug therapy: Secondary | ICD-10-CM

## 2021-12-29 DIAGNOSIS — J310 Chronic rhinitis: Secondary | ICD-10-CM

## 2021-12-29 DIAGNOSIS — Z1329 Encounter for screening for other suspected endocrine disorder: Secondary | ICD-10-CM | POA: Diagnosis not present

## 2021-12-29 DIAGNOSIS — I10 Essential (primary) hypertension: Secondary | ICD-10-CM

## 2021-12-29 DIAGNOSIS — R9230 Dense breasts, unspecified: Secondary | ICD-10-CM

## 2021-12-29 DIAGNOSIS — R922 Inconclusive mammogram: Secondary | ICD-10-CM

## 2021-12-29 DIAGNOSIS — Z1159 Encounter for screening for other viral diseases: Secondary | ICD-10-CM

## 2021-12-29 DIAGNOSIS — Z13220 Encounter for screening for lipoid disorders: Secondary | ICD-10-CM

## 2021-12-29 MED ORDER — LOSARTAN POTASSIUM 100 MG PO TABS
100.0000 mg | ORAL_TABLET | Freq: Every day | ORAL | 0 refills | Status: DC
  Filled 2021-12-29: qty 90, 90d supply, fill #0

## 2021-12-29 MED ORDER — LOSARTAN POTASSIUM 50 MG PO TABS
50.0000 mg | ORAL_TABLET | Freq: Every day | ORAL | 1 refills | Status: DC
  Filled 2021-12-29: qty 90, 90d supply, fill #0

## 2021-12-29 NOTE — Progress Notes (Signed)
Subjective:    Patient ID: Jennifer Harris, female    DOB: 05/11/1975, 46 y.o.   MRN: [id number]  HPI The patient comes in today for a wellness visit and recheck on HTN. Gets regular PE with GYN.     A review of their health history was completed.  A review of medications was also completed.  Any needed refills; losartan   Eating habits: good  Falls/  MVA accidents in past few months: no  Regular exercise: yes but not constantly  Specialist pt sees on regular basis: no  Preventative health issues were discussed.   Additional concerns:   HPI: Presents for annual exam. Reports Complains of waking up with scratchy throat and post-nasal-drainage almost every morning for past year. Denies cough. Denies reflux or abdominal discomfort. Does not drink coffee or coffee. No other allergies besides environmental. Denies smoking, alcohol use, or illicit drug use. Same sexual partner. Denies gynecologic or sexual concerns. No vaginal bleeding. Reports very dense breasts. Family history of breast cancer includes paternal grandmother and paternal aunt. Last mammogram 2022. Denies anxiety. Does not exercise consistently. Diet is healthy with mostly grilled meats. UTD on dental, and upcoming eye exam scheduled. Agrees to flu vaccine today.   Review of Systems  Constitutional:  Negative for appetite change, fatigue, fever and unexpected weight change.  HENT:  Positive for postnasal drip, rhinorrhea, sore throat and voice change. Negative for dental problem, hearing loss, sinus pain and trouble swallowing.   Eyes:  Negative for discharge, itching and visual disturbance.       Reports normal visual aging changes.   Respiratory:  Negative for cough, chest tightness, shortness of breath and wheezing.   Cardiovascular:  Negative for chest pain.  Gastrointestinal:  Negative for abdominal pain, constipation, diarrhea, nausea and vomiting.  Genitourinary:  Negative for difficulty urinating, dysuria,  frequency, genital sores, menstrual problem, pelvic pain, vaginal bleeding and vaginal discharge.  Allergic/Immunologic: Positive for environmental allergies.  Neurological:  Negative for dizziness.       Occasional headaches in the morning related to waking up with congestion.   Psychiatric/Behavioral:  Negative for sleep disturbance. The patient is not nervous/anxious.       12/29/2021    1:26 PM  Depression screen PHQ 2/9  Decreased Interest 0  Down, Depressed, Hopeless 0  PHQ - 2 Score 0      Objective:   Vitals:   12/29/21 1315 12/29/21 1444  BP: (!) 152/84 (!) 150/92  Pulse: 67   Temp: 98.1 F (36.7 C)   Weight: 60.8 kg   SpO2: 100%      Physical Exam Vitals and nursing note reviewed.  Constitutional:      General: She is not in acute distress. HENT:     Ears:     Comments: TMs retracted bilaterally, more on the right. No erythema.     Mouth/Throat:     Mouth: Mucous membranes are moist.     Pharynx: No oropharyngeal exudate or posterior oropharyngeal erythema.  Eyes:     General:        Right eye: No discharge.        Left eye: No discharge.  Neck:     Comments: Mild anterior cervical adenopathy.  Cardiovascular:     Rate and Rhythm: Normal rate and regular rhythm.     Pulses: Normal pulses.     Heart sounds: Normal heart sounds. No murmur heard.    No gallop.  Pulmonary:  Effort: Pulmonary effort is normal.     Breath sounds: Normal breath sounds.  Musculoskeletal:        General: No tenderness.     Cervical back: Normal range of motion and neck supple.     Right lower leg: No edema.     Left lower leg: No edema.  Lymphadenopathy:     Cervical: Cervical adenopathy present.  Skin:    General: Skin is warm and dry.     Findings: No lesion.  Neurological:     Mental Status: She is alert and oriented to person, place, and time.  Psychiatric:        Mood and Affect: Mood normal.        Behavior: Behavior normal.        Thought Content: Thought  content normal.        Judgment: Judgment normal.   IBIS Tyrer Cuzick Personal lifetime risk score 18.40%.     Assessment & Plan:   Problem List Items Addressed This Visit       Cardiovascular and Mediastinum   Essential hypertension - Primary   Relevant Medications   losartan (COZAAR) 100 MG tablet   Other Relevant Orders   TSH   Lipid panel   CBC with Differential/Platelet   Comprehensive metabolic panel     Respiratory   Chronic rhinitis     Other   Dense breasts   Other Visit Diagnoses     Immunization due       Relevant Orders   Flu Vaccine QUAD 60mo+IM (Fluarix, Fluzone & Alfiuria Quad PF) (Completed)   High risk medication use       Relevant Orders   TSH   Lipid panel   CBC with Differential/Platelet   Comprehensive metabolic panel   Screening for thyroid disorder       Relevant Orders   TSH   Lipid panel   CBC with Differential/Platelet   Comprehensive metabolic panel   Screening, lipid       Relevant Orders   Lipid panel   Encounter for hepatitis C screening test for low risk patient       Relevant Orders   Hepatitis C antibody        Recommend daily antihistamine and steroid nasal spray as needed to relieve symptoms of congestion. Call back if persists.  Losartan increased to 100 mg daily. Monitor BP and contact office if it remains above 140/90.  Flu vaccine received today. Continue healthy lifestyle habits. Discussed referral for genetic screening due to family history of breast cancer. She will consider and let us know if she wants consultation. Understands limit of mammogram due to dense breasts. Her life time breast cancer risk under 20%.  Mammogram in December as planned.  Return in about 6 months (around 06/30/2022).

## 2021-12-30 ENCOUNTER — Encounter: Payer: Self-pay | Admitting: Nurse Practitioner

## 2021-12-30 DIAGNOSIS — R922 Inconclusive mammogram: Secondary | ICD-10-CM | POA: Insufficient documentation

## 2021-12-30 DIAGNOSIS — J310 Chronic rhinitis: Secondary | ICD-10-CM | POA: Insufficient documentation

## 2021-12-30 DIAGNOSIS — R9230 Dense breasts, unspecified: Secondary | ICD-10-CM | POA: Insufficient documentation

## 2021-12-30 NOTE — Progress Notes (Signed)
   Subjective:    Patient ID: Jennifer Harris, female    DOB: 1975-09-07, 46 y.o.   MRN: [id number]  HPI    Review of Systems     Objective:   Physical Exam        Assessment & Plan:

## 2022-01-01 ENCOUNTER — Other Ambulatory Visit (HOSPITAL_COMMUNITY): Payer: Self-pay

## 2022-01-03 ENCOUNTER — Other Ambulatory Visit (HOSPITAL_COMMUNITY): Payer: Self-pay

## 2022-01-03 ENCOUNTER — Ambulatory Visit: Payer: Self-pay | Admitting: Nurse Practitioner

## 2022-02-28 ENCOUNTER — Other Ambulatory Visit: Payer: Self-pay | Admitting: Advanced Practice Midwife

## 2022-03-01 ENCOUNTER — Telehealth: Payer: Self-pay | Admitting: *Deleted

## 2022-03-01 ENCOUNTER — Other Ambulatory Visit: Payer: Self-pay | Admitting: Advanced Practice Midwife

## 2022-03-01 MED ORDER — NORETHINDRONE 0.35 MG PO TABS
1.0000 | ORAL_TABLET | Freq: Every day | ORAL | 4 refills | Status: DC
  Filled 2022-03-01 – 2022-05-24 (×2): qty 84, 84d supply, fill #0
  Filled 2022-08-15: qty 84, 84d supply, fill #1

## 2022-03-01 MED ORDER — NORETHINDRONE 0.35 MG PO TABS
1.0000 | ORAL_TABLET | Freq: Every day | ORAL | 4 refills | Status: DC
  Filled 2022-03-01: qty 84, 84d supply, fill #0
  Filled 2022-11-08: qty 84, 84d supply, fill #1

## 2022-03-01 NOTE — Telephone Encounter (Signed)
Pt needs a refill on Micronor. Thank you! JSY

## 2022-03-02 ENCOUNTER — Other Ambulatory Visit (HOSPITAL_COMMUNITY): Payer: Self-pay

## 2022-03-02 ENCOUNTER — Other Ambulatory Visit (HOSPITAL_COMMUNITY): Payer: Self-pay | Admitting: Nurse Practitioner

## 2022-03-02 DIAGNOSIS — Z1231 Encounter for screening mammogram for malignant neoplasm of breast: Secondary | ICD-10-CM

## 2022-03-16 ENCOUNTER — Encounter (HOSPITAL_COMMUNITY): Payer: Self-pay | Admitting: Radiology

## 2022-03-16 ENCOUNTER — Ambulatory Visit (HOSPITAL_COMMUNITY)
Admission: RE | Admit: 2022-03-16 | Discharge: 2022-03-16 | Disposition: A | Discharge status: Home / Self Care | Payer: No Typology Code available for payment source | Source: Ambulatory Visit | Attending: Nurse Practitioner | Admitting: Nurse Practitioner

## 2022-03-16 DIAGNOSIS — Z1231 Encounter for screening mammogram for malignant neoplasm of breast: Secondary | ICD-10-CM | POA: Insufficient documentation

## 2022-03-22 ENCOUNTER — Other Ambulatory Visit: Payer: Self-pay | Admitting: Nurse Practitioner

## 2022-03-23 ENCOUNTER — Other Ambulatory Visit: Payer: Self-pay

## 2022-03-23 MED ORDER — LOSARTAN POTASSIUM 100 MG PO TABS
100.0000 mg | ORAL_TABLET | Freq: Every day | ORAL | 0 refills | Status: DC
  Filled 2022-03-23: qty 90, 90d supply, fill #0

## 2022-05-24 ENCOUNTER — Other Ambulatory Visit: Payer: Self-pay

## 2022-05-24 ENCOUNTER — Other Ambulatory Visit (HOSPITAL_COMMUNITY): Payer: Self-pay

## 2022-05-25 ENCOUNTER — Other Ambulatory Visit (HOSPITAL_COMMUNITY): Payer: Self-pay

## 2022-06-30 ENCOUNTER — Other Ambulatory Visit: Payer: Self-pay | Admitting: Family Medicine

## 2022-07-02 ENCOUNTER — Other Ambulatory Visit: Payer: Self-pay

## 2022-07-02 ENCOUNTER — Other Ambulatory Visit (HOSPITAL_COMMUNITY): Payer: Self-pay

## 2022-07-02 MED ORDER — LOSARTAN POTASSIUM 100 MG PO TABS
100.0000 mg | ORAL_TABLET | Freq: Every day | ORAL | 0 refills | Status: DC
  Filled 2022-07-02: qty 90, 90d supply, fill #0

## 2022-08-03 DIAGNOSIS — H40013 Open angle with borderline findings, low risk, bilateral: Secondary | ICD-10-CM | POA: Diagnosis not present

## 2022-08-15 ENCOUNTER — Other Ambulatory Visit (HOSPITAL_COMMUNITY): Payer: Self-pay

## 2022-09-24 ENCOUNTER — Other Ambulatory Visit: Payer: Self-pay | Admitting: Family Medicine

## 2022-09-24 ENCOUNTER — Other Ambulatory Visit: Payer: Self-pay

## 2022-09-24 MED ORDER — LOSARTAN POTASSIUM 100 MG PO TABS
100.0000 mg | ORAL_TABLET | Freq: Every day | ORAL | 0 refills | Status: DC
  Filled 2022-09-24: qty 90, 90d supply, fill #0

## 2022-11-08 ENCOUNTER — Other Ambulatory Visit (HOSPITAL_COMMUNITY): Payer: Self-pay

## 2022-11-09 ENCOUNTER — Other Ambulatory Visit: Payer: Self-pay

## 2022-12-04 ENCOUNTER — Other Ambulatory Visit (HOSPITAL_COMMUNITY): Payer: Self-pay | Admitting: Nurse Practitioner

## 2022-12-04 DIAGNOSIS — Z1231 Encounter for screening mammogram for malignant neoplasm of breast: Secondary | ICD-10-CM

## 2023-01-04 ENCOUNTER — Other Ambulatory Visit (HOSPITAL_COMMUNITY): Payer: Self-pay

## 2023-01-04 ENCOUNTER — Ambulatory Visit (INDEPENDENT_AMBULATORY_CARE_PROVIDER_SITE_OTHER): Payer: 59 | Admitting: Nurse Practitioner

## 2023-01-04 VITALS — BP 114/75 | HR 61 | Temp 98.1°F | Ht 66.0 in | Wt 138.0 lb

## 2023-01-04 DIAGNOSIS — R9230 Dense breasts, unspecified: Secondary | ICD-10-CM

## 2023-01-04 DIAGNOSIS — Z803 Family history of malignant neoplasm of breast: Secondary | ICD-10-CM | POA: Diagnosis not present

## 2023-01-04 DIAGNOSIS — Z79899 Other long term (current) drug therapy: Secondary | ICD-10-CM | POA: Diagnosis not present

## 2023-01-04 DIAGNOSIS — Z13220 Encounter for screening for lipoid disorders: Secondary | ICD-10-CM | POA: Diagnosis not present

## 2023-01-04 DIAGNOSIS — Z01419 Encounter for gynecological examination (general) (routine) without abnormal findings: Secondary | ICD-10-CM

## 2023-01-04 DIAGNOSIS — Z1211 Encounter for screening for malignant neoplasm of colon: Secondary | ICD-10-CM

## 2023-01-04 DIAGNOSIS — Z1329 Encounter for screening for other suspected endocrine disorder: Secondary | ICD-10-CM | POA: Diagnosis not present

## 2023-01-04 DIAGNOSIS — I10 Essential (primary) hypertension: Secondary | ICD-10-CM

## 2023-01-04 DIAGNOSIS — Z01411 Encounter for gynecological examination (general) (routine) with abnormal findings: Secondary | ICD-10-CM

## 2023-01-04 DIAGNOSIS — Z1151 Encounter for screening for human papillomavirus (HPV): Secondary | ICD-10-CM | POA: Diagnosis not present

## 2023-01-04 DIAGNOSIS — Z124 Encounter for screening for malignant neoplasm of cervix: Secondary | ICD-10-CM | POA: Diagnosis not present

## 2023-01-04 DIAGNOSIS — Z1159 Encounter for screening for other viral diseases: Secondary | ICD-10-CM | POA: Diagnosis not present

## 2023-01-04 MED ORDER — NORETHINDRONE 0.35 MG PO TABS
1.0000 | ORAL_TABLET | Freq: Every day | ORAL | 3 refills | Status: DC
  Filled 2023-01-04 – 2023-02-04 (×3): qty 84, 84d supply, fill #0
  Filled 2023-04-27 – 2023-04-30 (×2): qty 84, 84d supply, fill #1
  Filled 2023-07-19: qty 84, 84d supply, fill #2
  Filled 2023-10-11: qty 84, 84d supply, fill #3

## 2023-01-04 MED ORDER — LOSARTAN POTASSIUM 100 MG PO TABS
100.0000 mg | ORAL_TABLET | Freq: Every day | ORAL | 0 refills | Status: DC
  Filled 2023-01-04: qty 30, 30d supply, fill #0

## 2023-01-04 MED ORDER — PROPRANOLOL HCL ER 60 MG PO CP24
60.0000 mg | ORAL_CAPSULE | Freq: Every day | ORAL | 0 refills | Status: DC
  Filled 2023-01-04: qty 90, 90d supply, fill #0

## 2023-01-04 NOTE — Progress Notes (Signed)
Subjective:    Patient ID: Jennifer Harris, female    DOB: 11/22/1975, 47 y.o.   MRN: [id number]  HPI The patient comes in today for a wellness visit.    A review of their health history was completed.  A review of medications was also completed.  Any needed refills; no  Eating habits: good; improved eating more vegetables and lean meats  Regular exercise: very active job requiring a lot of walking  Specialist pt sees on regular basis: no  Preventative health issues were discussed.  Positive family history of breast cancer including: all paternal: 2 aunts, gmother and cousin. The cousin developed breast cancer at a younger age. Patient has a history of dense breasts.  Same sexual partner. No BTB on Micronor. Regular dental and vision exams.  Will get flu vaccine through employer.     Review of Systems  Constitutional:  Negative for activity change, appetite change and fatigue.  HENT:  Negative for sore throat and trouble swallowing.   Respiratory:  Negative for cough, chest tightness, shortness of breath and wheezing.   Cardiovascular:  Negative for chest pain.  Gastrointestinal:  Negative for abdominal distention, abdominal pain, constipation, diarrhea, nausea and vomiting.  Genitourinary:  Negative for difficulty urinating, dysuria, enuresis, frequency, genital sores, menstrual problem, pelvic pain, urgency, vaginal bleeding and vaginal discharge.      01/04/2023    1:22 PM  Depression screen PHQ 2/9  Decreased Interest 0  Down, Depressed, Hopeless 0  PHQ - 2 Score 0  Altered sleeping 0  Tired, decreased energy 0  Change in appetite 0  Feeling bad or failure about yourself  0  Trouble concentrating 0  Moving slowly or fidgety/restless 0  Suicidal thoughts 0  PHQ-9 Score 0  Difficult doing work/chores Not difficult at all      01/04/2023    1:22 PM 12/17/2019    3:59 PM  GAD 7 : Generalized Anxiety Score  Nervous, Anxious, on Edge 0 0  Control/stop  worrying 0 0  Worry too much - different things 0 0  Trouble relaxing 0 0  Restless 0 0  Easily annoyed or irritable 0 0  Afraid - awful might happen 0 0  Total GAD 7 Score 0 0  Anxiety Difficulty Not difficult at all          Objective:   Physical Exam Vitals and nursing note reviewed. Chaperone present: defers chaperone.  Constitutional:      General: She is not in acute distress.    Appearance: She is well-developed.  Neck:     Thyroid: No thyromegaly.     Trachea: No tracheal deviation.     Comments: Thyroid non tender to palpation. No mass or goiter noted.  Cardiovascular:     Rate and Rhythm: Normal rate and regular rhythm.     Heart sounds: Normal heart sounds. No murmur heard. Pulmonary:     Effort: Pulmonary effort is normal.     Breath sounds: Normal breath sounds.  Chest:  Breasts:    Right: No swelling, inverted nipple, mass, skin change or tenderness.     Left: No swelling, inverted nipple, mass, skin change or tenderness.  Abdominal:     General: There is no distension.     Palpations: Abdomen is soft.     Tenderness: There is no abdominal tenderness.  Genitourinary:    General: Normal vulva.     Exam position: Lithotomy position.     Labia:  Right: No rash, tenderness or lesion.        Left: No rash, tenderness or lesion.      Vagina: No vaginal discharge, erythema, tenderness or lesions.     Cervix: No cervical motion tenderness, discharge, friability, lesion or erythema.     Comments: Bimanual exam: no tenderness or obvious masses Musculoskeletal:     Cervical back: Normal range of motion and neck supple.  Lymphadenopathy:     Cervical: No cervical adenopathy.     Upper Body:     Right upper body: No supraclavicular, axillary or pectoral adenopathy.     Left upper body: No supraclavicular, axillary or pectoral adenopathy.  Skin:    General: Skin is warm and dry.     Findings: No rash.  Neurological:     Mental Status: She is alert and  oriented to person, place, and time.  Psychiatric:        Mood and Affect: Mood normal.        Behavior: Behavior normal.        Thought Content: Thought content normal.        Judgment: Judgment normal.    Today's Vitals   01/04/23 1308  BP: 114/75  Pulse: 61  Temp: 98.1 F (36.7 C)  SpO2: 99%  Weight: 138 lb (62.6 kg)  Height: 5\' 6"  (1.676 m)   Body mass index is 22.27 kg/m.        Assessment & Plan:   Problem List Items Addressed This Visit       Cardiovascular and Mediastinum   Essential hypertension   Relevant Medications   losartan (COZAAR) 100 MG tablet   Other Relevant Orders   Comprehensive metabolic panel (Completed)   CBC with Differential (Completed)     Other   Dense breasts   Relevant Orders   Ambulatory referral to Genetics   Family history of breast cancer   Relevant Orders   Ambulatory referral to Genetics   Other Visit Diagnoses     Well woman exam    -  Primary   Relevant Orders   Hepatitis C Antibody (Completed)   Comprehensive metabolic panel (Completed)   CBC with Differential (Completed)   Lipid Panel (Completed)   TSH (Completed)   IGP, Aptima HPV   High risk medication use       Relevant Orders   Comprehensive metabolic panel (Completed)   Screening for thyroid disorder       Relevant Orders   TSH (Completed)   Encounter for hepatitis C screening test for low risk patient       Relevant Orders   Hepatitis C Antibody (Completed)   Screening, lipid       Relevant Orders   Lipid Panel (Completed)   Screening for cervical cancer       Relevant Orders   IGP, Aptima HPV   Screening for HPV (human papillomavirus)       Relevant Orders   IGP, Aptima HPV   Screen for colon cancer       Relevant Orders   Ambulatory referral to Gastroenterology      Initially switched to Propranolol for BP per patient request. Received message from pharmacy that patient wanted to switch back to Losartan due to costs.  Meds ordered this  encounter  Medications   norethindrone (ORTHO MICRONOR) 0.35 MG tablet    Sig: Take 1 tablet (0.35 mg total) by mouth daily.    Dispense:  84 tablet    Refill:  3    Order Specific Question:   Supervising Provider    Answer:   Babs Sciara [[id number]]   DISCONTD: propranolol ER (INDERAL LA) 60 MG 24 hr capsule    Sig: Take 1 capsule (60 mg total) by mouth daily.    Dispense:  90 capsule    Refill:  0    Order Specific Question:   Supervising Provider    Answer:   Lilyan Punt A [[id number]]   losartan (COZAAR) 100 MG tablet    Sig: Take 1 tablet (100 mg total) by mouth daily.    Dispense:  90 tablet    Refill:  1    Order Specific Question:   Supervising Provider    Answer:   Lilyan Punt A [[id number]]   Continue Ortho Micronor. Labs ordered.  Mammogram due in December. Due to family history, referred to genetics clinic. Recommend colon cancer screening. Patient chooses colonoscopy so referral placed.  Continue healthy habits.  Return in about 6 months (around 07/05/2023).

## 2023-01-05 ENCOUNTER — Encounter: Payer: Self-pay | Admitting: Nurse Practitioner

## 2023-01-05 DIAGNOSIS — Z803 Family history of malignant neoplasm of breast: Secondary | ICD-10-CM | POA: Insufficient documentation

## 2023-01-05 LAB — LIPID PANEL
Chol/HDL Ratio: 2.7 {ratio} (ref 0.0–4.4)
Cholesterol, Total: 175 mg/dL (ref 100–199)
HDL: 64 mg/dL (ref >39)
LDL Chol Calc (NIH): 102 mg/dL — ABNORMAL HIGH (ref 0–99)
Triglycerides: 42 mg/dL (ref 0–149)
VLDL Cholesterol Cal: 9 mg/dL (ref 5–40)

## 2023-01-05 LAB — COMPREHENSIVE METABOLIC PANEL
ALT: 11 [IU]/L (ref 0–32)
AST: 14 [IU]/L (ref 0–40)
Albumin: 4.7 g/dL (ref 3.9–4.9)
Alkaline Phosphatase: 106 [IU]/L (ref 44–121)
BUN/Creatinine Ratio: 20 (ref 9–23)
BUN: 13 mg/dL (ref 6–24)
Bilirubin Total: 0.7 mg/dL (ref 0.0–1.2)
CO2: 20 mmol/L (ref 20–29)
Calcium: 9.2 mg/dL (ref 8.7–10.2)
Chloride: 104 mmol/L (ref 96–106)
Creatinine, Ser: 0.66 mg/dL (ref 0.57–1.00)
Globulin, Total: 2.5 g/dL (ref 1.5–4.5)
Glucose: 77 mg/dL (ref 70–99)
Potassium: 4.3 mmol/L (ref 3.5–5.2)
Sodium: 141 mmol/L (ref 134–144)
Total Protein: 7.2 g/dL (ref 6.0–8.5)
eGFR: 109 mL/min/{1.73_m2} (ref >59)

## 2023-01-05 LAB — TSH: TSH: 1.84 u[IU]/mL (ref 0.450–4.500)

## 2023-01-05 LAB — CBC WITH DIFFERENTIAL/PLATELET
Basophils Absolute: 0 10*3/uL (ref 0.0–0.2)
Basos: 0 %
EOS (ABSOLUTE): 0 10*3/uL (ref 0.0–0.4)
Eos: 0 %
Hematocrit: 37.8 % (ref 34.0–46.6)
Hemoglobin: 12.5 g/dL (ref 11.1–15.9)
Immature Grans (Abs): 0 10*3/uL (ref 0.0–0.1)
Immature Granulocytes: 0 %
Lymphocytes Absolute: 1.8 10*3/uL (ref 0.7–3.1)
Lymphs: 24 %
MCH: 31.6 pg (ref 26.6–33.0)
MCHC: 33.1 g/dL (ref 31.5–35.7)
MCV: 96 fL (ref 79–97)
Monocytes Absolute: 0.4 10*3/uL (ref 0.1–0.9)
Monocytes: 5 %
Neutrophils Absolute: 5.4 10*3/uL (ref 1.4–7.0)
Neutrophils: 71 %
Platelets: 273 10*3/uL (ref 150–450)
RBC: 3.96 x10E6/uL (ref 3.77–5.28)
RDW: 11.8 % (ref 11.7–15.4)
WBC: 7.7 10*3/uL (ref 3.4–10.8)

## 2023-01-05 LAB — HEPATITIS C ANTIBODY: Hep C Virus Ab: NONREACTIVE

## 2023-01-05 MED ORDER — LOSARTAN POTASSIUM 100 MG PO TABS
100.0000 mg | ORAL_TABLET | Freq: Every day | ORAL | 1 refills | Status: DC
  Filled 2023-01-05 – 2023-01-27 (×2): qty 90, 90d supply, fill #0
  Filled 2023-04-29: qty 90, 90d supply, fill #1

## 2023-01-07 ENCOUNTER — Other Ambulatory Visit (HOSPITAL_COMMUNITY): Payer: Self-pay

## 2023-01-10 LAB — IGP, APTIMA HPV: HPV Aptima: NEGATIVE

## 2023-01-17 ENCOUNTER — Encounter: Payer: Self-pay | Admitting: Gastroenterology

## 2023-01-28 ENCOUNTER — Other Ambulatory Visit: Payer: Self-pay

## 2023-02-05 ENCOUNTER — Other Ambulatory Visit: Payer: Self-pay

## 2023-02-19 ENCOUNTER — Other Ambulatory Visit: Payer: 59

## 2023-02-19 ENCOUNTER — Encounter: Payer: 59 | Admitting: Genetic Counselor

## 2023-02-25 ENCOUNTER — Encounter: Payer: 59 | Admitting: Gastroenterology

## 2023-03-22 ENCOUNTER — Other Ambulatory Visit (HOSPITAL_COMMUNITY): Payer: Self-pay

## 2023-03-22 ENCOUNTER — Ambulatory Visit (AMBULATORY_SURGERY_CENTER): Payer: 59 | Admitting: *Deleted

## 2023-03-22 ENCOUNTER — Telehealth: Payer: Self-pay | Admitting: *Deleted

## 2023-03-22 VITALS — Ht 66.0 in | Wt 138.0 lb

## 2023-03-22 DIAGNOSIS — Z1211 Encounter for screening for malignant neoplasm of colon: Secondary | ICD-10-CM

## 2023-03-22 MED ORDER — NA SULFATE-K SULFATE-MG SULF 17.5-3.13-1.6 GM/177ML PO SOLN
1.0000 | Freq: Once | ORAL | 0 refills | Status: AC
  Filled 2023-03-22: qty 354, 1d supply, fill #0

## 2023-03-22 NOTE — Progress Notes (Signed)
 Pt's name and DOB verified at the beginning of the pre-visit wit 2 identifiers  Pt denies any difficulty with ambulating,sitting, laying down or rolling side to side  Pt has no issues with ambulation   Pt has no issues moving head neck or swallowing  No egg or soy allergy known to patient   No issues known to pt with past sedation with any surgeries or procedures Patient denies ever being intubated  No FH of Malignant Hyperthermia  Pt is not on diet pills or shots  Pt is not on home 02   Pt is not on blood thinners   Pt denies issues with constipation   Pt has frequent issues with constipation RN instructed pt to use Miralax per bottles instructions a week before prep days. Pt states they will  Pt is not on dialysis  Pt denise any abnormal heart rhythms   Pt denies any upcoming cardiac testing  Pt encouraged to use to use Singlecare or Goodrx to reduce cost   Patient's chart reviewed by Cathlyn Parsons CNRA prior to pre-visit and patient appropriate for the [facility].  Pre-visit completed and red dot placed by patient's name on their procedure day (on provider's schedule).  .  Visit by phone   Pt states weight is    Instructed pt why it is important to and  to call if they have any changes in health or new medications. Directed them to the # given and on instructions.     Instructions reviewed. Pt given both [facility] main # and MD on call # prior to instructions.  Pt states understanding. Instructed to review again prior to procedure. Pt states they will.   Instructions sent by mail with coupon and by My Chart  Coupon sent via text to mobile phone and pt verified they received it

## 2023-03-22 NOTE — Telephone Encounter (Signed)
Attempt to reach pt for pre-visit. LM with call back #.   Will attempt other number in profile  2nd attempt reached pt

## 2023-03-25 ENCOUNTER — Ambulatory Visit (HOSPITAL_COMMUNITY)
Admission: RE | Admit: 2023-03-25 | Discharge: 2023-03-25 | Disposition: A | Discharge status: Home / Self Care | Payer: 59 | Source: Ambulatory Visit | Attending: Nurse Practitioner | Admitting: Nurse Practitioner

## 2023-03-25 ENCOUNTER — Encounter (HOSPITAL_COMMUNITY): Payer: Self-pay

## 2023-03-25 DIAGNOSIS — Z1231 Encounter for screening mammogram for malignant neoplasm of breast: Secondary | ICD-10-CM | POA: Insufficient documentation

## 2023-03-28 ENCOUNTER — Inpatient Hospital Stay: Payer: 59 | Attending: Nurse Practitioner

## 2023-03-28 ENCOUNTER — Inpatient Hospital Stay (HOSPITAL_BASED_OUTPATIENT_CLINIC_OR_DEPARTMENT_OTHER): Payer: 59 | Admitting: Genetic Counselor

## 2023-03-28 ENCOUNTER — Other Ambulatory Visit: Payer: Self-pay | Admitting: Genetic Counselor

## 2023-03-28 DIAGNOSIS — Z8051 Family history of malignant neoplasm of kidney: Secondary | ICD-10-CM | POA: Diagnosis not present

## 2023-03-28 DIAGNOSIS — Z8049 Family history of malignant neoplasm of other genital organs: Secondary | ICD-10-CM

## 2023-03-28 DIAGNOSIS — Z803 Family history of malignant neoplasm of breast: Secondary | ICD-10-CM | POA: Diagnosis not present

## 2023-03-28 DIAGNOSIS — Z8041 Family history of malignant neoplasm of ovary: Secondary | ICD-10-CM

## 2023-03-28 DIAGNOSIS — Z1379 Encounter for other screening for genetic and chromosomal anomalies: Secondary | ICD-10-CM

## 2023-03-28 LAB — GENETIC SCREENING ORDER

## 2023-03-28 NOTE — Progress Notes (Signed)
 REFERRING PROVIDER: Alphonsa Glendia LABOR, MD [address] [place],  [place] [address]  PRIMARY PROVIDER:  Alphonsa Glendia LABOR, MD  PRIMARY REASON FOR VISIT:  Encounter Diagnoses  Name Primary?   Family history of breast cancer Yes   Family history of ovarian cancer    Family history of uterine cancer    HISTORY OF PRESENT ILLNESS:   Jennifer Harris, a 48 y.o. female, was seen for a [facility] cancer genetics consultation at the request of Dr. Alphonsa due to a family history of cancer.  Jennifer Harris presents to clinic today to discuss the possibility of a hereditary predisposition to cancer, to discuss genetic testing, and to further clarify her future cancer risks, as well as potential cancer risks for family members.   Jennifer Harris is a 48 y.o. female with no personal history of cancer.    RISK FACTORS:  Menarche was at age 57.  First live birth at age 21.  OCP use for approximately  30 years.  Ovaries intact: yes.  Uterus intact: yes.  Menopausal status: unsure.  HRT use: 0 years. Colonoscopy: scheduled to have her first one this year Mammogram within the last year: yes. Number of breast biopsies: 0. Up to date with pelvic exams: yes. Any excessive radiation exposure in the past: no  Past Medical History:  Diagnosis Date   BV (bacterial vaginosis) 05/29/2013   Hypertension    IBS (irritable bowel syndrome)    Other and unspecified ovarian cyst 05/29/2013   RLQ abdominal pain 05/29/2013   Vaginal discharge 07/06/2013   Vitamin D  deficiency     Past Surgical History:  Procedure Laterality Date   FLEXIBLE SIGMOIDOSCOPY  03/26/2008   WISDOM TOOTH EXTRACTION      Social History   Socioeconomic History   Marital status: Married    Spouse name: Not on file   Number of children: Not on file   Years of education: Not on file   Highest education level: Not on file  Occupational History   Not on file  Tobacco Use   Smoking status: Never   Smokeless tobacco: Never   Vaping Use   Vaping status: Never Used  Substance and Sexual Activity   Alcohol use: No   Drug use: No   Sexual activity: Yes    Birth control/protection: Pill  Other Topics Concern   Not on file  Social History Narrative   Not on file   Social Drivers of Health   Financial Resource Strain: Low Risk  (12/17/2019)   Overall Financial Resource Strain (CARDIA)    Difficulty of Paying Living Expenses: Not hard at all  Food Insecurity: No Food Insecurity (12/17/2019)   Hunger Vital Sign    Worried About Running Out of Food in the Last Year: Never true    Ran Out of Food in the Last Year: Never true  Transportation Needs: No Transportation Needs (12/17/2019)   PRAPARE - Administrator, Civil Service (Medical): No    Lack of Transportation (Non-Medical): No  Physical Activity: Inactive (12/17/2019)   Exercise Vital Sign    Days of Exercise per Week: 0 days    Minutes of Exercise per Session: 0 min  Stress: No Stress Concern Present (12/17/2019)   [employer] of Occupational Health - Occupational Stress Questionnaire    Feeling of Stress : Not at all  Social Connections: Moderately Integrated (12/17/2019)   Social Connection and Isolation Panel [NHANES]    Frequency of Communication with Friends  and Family: More than three times a week    Frequency of Social Gatherings with Friends and Family: Once a week    Attends Religious Services: More than 4 times per year    Active Member of [employer] or Organizations: No    Attends Banker Meetings: Never    Marital Status: Married     FAMILY HISTORY:  We obtained a detailed, 4-generation family history.  Significant diagnoses are listed below: Family History  Problem Relation Age of Onset   Ovarian cancer Mother 53   Kidney cancer Maternal Aunt 58 - 89   Breast cancer Paternal Aunt 92   Breast cancer Paternal Aunt 22   Uterine cancer Paternal Aunt 43 - 26   Leukemia Paternal Uncle 52   Breast cancer Maternal  Grandmother    Breast cancer Paternal Grandmother    Esophageal cancer Cousin        maternal first cousin, smoked        Jennifer Harris reports her mother was diagnosed with stage IV ovarian cancer at age 43, she is currently 39. Her maternal aunt was diagnosed with kidney cancer in her 44s, she died in her 43s. One maternal first cousin was diagnosed with esophageal cancer at an unknown age, he smoked. Her maternal grandmother was diagnosed with breast cancer at an unknown age, she is deceased.   Jennifer Harris has 4 paternal aunts, one was diagnosed with breast cancer at age 39 (she is currently 13), one was diagnosed with breast cancer at age 71 (she is currently 56), and one was diagnosed with uterine cancer in her 53s (she is currently 85). One of Jennifer Harris's paternal first cousins was diagnosed with breast cancer at age 61 (she is currently in her 41s). Her paternal grandmother was diagnosed with breast cancer at an unknown age, she is deceased. Jennifer Harris is unaware of previous family history of genetic testing for hereditary cancer risks. There is no reported Ashkenazi Jewish ancestry.   GENETIC COUNSELING ASSESSMENT: Jennifer Harris is a 48 y.o. female with a family history of cancer which is somewhat suggestive of a hereditary predisposition to cancer given her family history of breast and ovarian cancer. We, therefore, discussed and recommended the following at today's visit.   DISCUSSION: We discussed that 5 - 10% of cancer is hereditary, with most cases of hereditary breast cancer associated with BRCA1/2.  There are other genes that can be associated with hereditary breast cancer syndromes.  We discussed that testing is beneficial for several reasons, including knowing about other cancer risks, identifying potential screening and risk-reduction options that may be appropriate, and to understanding if other family members could be at risk for cancer and allowing them to undergo  genetic testing.  We reviewed the characteristics, features and inheritance patterns of hereditary cancer syndromes. We also discussed genetic testing, including the appropriate family members to test, the process of testing, insurance coverage and turn-around-time for results. We discussed the implications of a negative, positive, carrier and/or variant of uncertain significant result. We discussed that negative results would be uninformative given that Jennifer Harris does not have a personal history of cancer.   Jennifer Harris was offered a common hereditary cancer panel (39 genes) and an expanded pan-cancer panel (76 genes). Jennifer Harris was informed of the benefits and limitations of each panel, including that expanded pan-cancer panels contain several genes that do not have clear management guidelines at this point in time.  We also discussed that as the  number of genes included on a panel increases, the chances of variants of uncertain significance increases.  After considering the benefits and limitations of each gene panel, Jennifer Harris elected to have [facility] CancerNext-Expanded Panel.  The CancerNext-Expanded gene panel offered by [facility] and includes sequencing, rearrangement, and RNA analysis for the following 76 genes: AIP, ALK, APC, ATM, AXIN2, BAP1, BARD1, BMPR1A, BRCA1, BRCA2, BRIP1, CDC73, CDH1, CDK4, CDKN1B, CDKN2A, CEBPA, CHEK2, CTNNA1, DDX41, DICER1, ETV6, FH, FLCN, GATA2, LZTR1, MAX, MBD4, MEN1, MET, MLH1, MSH2, MSH3, MSH6, MUTYH, NF1, NF2, NTHL1, PALB2, PHOX2B, PMS2, POT1, PRKAR1A, PTCH1, PTEN, RAD51C, RAD51D, RB1, RET, RUNX1, SDHA, SDHAF2, SDHB, SDHC, SDHD, SMAD4, SMARCA4, SMARCB1, SMARCE1, STK11, SUFU, TMEM127, TP53, TSC1, TSC2, VHL, and WT1 (sequencing and deletion/duplication); EGFR, HOXB13, KIT, MITF, PDGFRA, POLD1, and POLE (sequencing only); EPCAM and GREM1 (deletion/duplication only).     Based on Jennifer Harris's family history of cancer, she meets medical criteria for  genetic testing. Though Jennifer Harris is not personally affected, there are no affected family members that are willing/able to undergo hereditary cancer testing. Despite that she meets criteria, she may still have an out of pocket cost. We discussed that if her out of pocket cost for testing is over $100, the laboratory should contact them to discuss self-pay prices, patient pay assistance programs, if applicable, and other billing options.  We discussed that some people do not want to undergo genetic testing due to fear of genetic discrimination.  A federal law called the Genetic Information Non-Discrimination Act (GINA) of 2008 helps protect individuals against genetic discrimination based on their genetic test results.  It impacts both health insurance and employment.  With health insurance, it protects against increased premiums, being kicked off insurance or being forced to take a test in order to be insured.  For employment it protects against hiring, firing and promoting decisions based on genetic test results.  GINA does not apply to those in the [employer], those who work for companies with less than 15 employees, and new life insurance or long-term disability insurance policies.  Health status due to a cancer diagnosis is not protected under GINA.  PLAN: After considering the risks, benefits, and limitations, Jennifer Harris provided informed consent to pursue genetic testing and the blood sample was sent to [facility] for analysis of the CancerNext-Expanded Panel. Results should be available within approximately 2-3 weeks' time, at which point they will be disclosed by telephone to Jennifer Harris, as will any additional recommendations warranted by these results. Jennifer Harris will receive a summary of her genetic counseling visit and a copy of her results once available. This information will also be available in Epic.   Jennifer Harris questions were answered to her satisfaction today. Our  contact information was provided should additional questions or concerns arise. Thank you for the referral and allowing us  to share in the care of your patient.   Amrit Erck, MS, [facility] Genetic Counselor [email or web address] (P) [phone]  60 minutes were spent on the date of the encounter in service to the patient including preparation, face-to-face consultation, documentation and care coordination.  The patient brought her husband. Drs. Gudena and/or Lanny were available to discuss this case as needed.  _______________________________________________________________________ For Office Staff:  Number of people involved in session: 2 Was an Intern/ student involved with case: no

## 2023-03-29 ENCOUNTER — Encounter: Payer: Self-pay | Admitting: Genetic Counselor

## 2023-04-15 ENCOUNTER — Telehealth: Payer: Self-pay | Admitting: Genetic Counselor

## 2023-04-15 ENCOUNTER — Ambulatory Visit: Payer: Self-pay | Admitting: Genetic Counselor

## 2023-04-15 ENCOUNTER — Encounter: Payer: Self-pay | Admitting: Genetic Counselor

## 2023-04-15 DIAGNOSIS — Z8049 Family history of malignant neoplasm of other genital organs: Secondary | ICD-10-CM

## 2023-04-15 DIAGNOSIS — Z803 Family history of malignant neoplasm of breast: Secondary | ICD-10-CM

## 2023-04-15 DIAGNOSIS — Z1589 Genetic susceptibility to other disease: Secondary | ICD-10-CM

## 2023-04-15 DIAGNOSIS — Z1379 Encounter for other screening for genetic and chromosomal anomalies: Secondary | ICD-10-CM

## 2023-04-15 DIAGNOSIS — Z8041 Family history of malignant neoplasm of ovary: Secondary | ICD-10-CM

## 2023-04-15 HISTORY — DX: Genetic susceptibility to other disease: Z15.89

## 2023-04-15 NOTE — Telephone Encounter (Signed)
Contacted patient in attempt to disclose results of genetic testing.  LVM with contact information requesting a call back.  

## 2023-04-15 NOTE — Telephone Encounter (Signed)
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  Discussed TC score at intermediate risk 17.5.  Given low penetrance CHEK2 mutation and intermediate risk TC score, we discussed high risk breast screening may be reasonable.  Patient declined referral to [facility] breast clinic.  Recommended she speak with her GYN about breast MRIs.  Detailed clinic note to follow

## 2023-04-17 ENCOUNTER — Encounter: Payer: Self-pay | Admitting: Gastroenterology

## 2023-04-18 ENCOUNTER — Encounter: Payer: Self-pay | Admitting: Genetic Counselor

## 2023-04-18 NOTE — Progress Notes (Addendum)
GENETIC TEST RESULTS  Patient Name: Jennifer Harris Patient Age: 48 y.o. Encounter Date: 04/15/2023  Referring Provider: Sherie Don, NP   Jennifer Harris was seen in the Cancer Genetics clinic on January 2,2025 due to a family history of breast, ovarian, and other cancers and concern regarding a hereditary predisposition to cancer in the family. Please refer to the prior Genetics clinic note for more information regarding Jennifer Harris's medical and family histories and our assessment at the time.   Genetic testing results were disclosed to Jennifer Harris by telephone.   FAMILY HISTORY:  We obtained a detailed, 4-generation family history.  Significant diagnoses are listed below:      Family History  Problem Relation Age of Onset   Ovarian cancer Mother 15   Kidney cancer Maternal Aunt 88 - 89   Breast cancer Paternal Aunt 23   Breast cancer Paternal Aunt 15   Uterine cancer Paternal Aunt 106 - 69   Leukemia Paternal Uncle 56   Breast cancer Maternal Grandmother     Breast cancer Paternal Grandmother     Esophageal cancer Cousin          maternal first cousin, smoked             Jennifer Harris reports her mother was diagnosed with stage IV ovarian cancer at age 19, she is currently 30. Her maternal aunt was diagnosed with kidney cancer in her 90s, she died in her 54s. One maternal first cousin was diagnosed with esophageal cancer at an unknown age, he smoked. Her maternal grandmother was diagnosed with breast cancer at an unknown age, she is deceased.    Jennifer Harris has 4 paternal aunts, one was diagnosed with breast cancer at age 75 (she is currently 11), one was diagnosed with breast cancer at age 80 (she is currently 54), and one was diagnosed with uterine cancer in her 61s (she is currently 69). One of Jennifer Harris's paternal first cousins was diagnosed with breast cancer at age 11 (she is currently in her 48s). Her paternal grandmother was diagnosed with breast cancer  at an unknown age, she is deceased. Jennifer Harris is unaware of previous family history of genetic testing for hereditary cancer risks. There is no reported Ashkenazi Jewish ancestry.    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 Specifically, this variant is  p.I157T (c.470T>C).   No other deleterious variants were detected in the [facility] CancerNext-Expanded +RNAinsight Panel.  The CancerNext-Expanded gene panel offered by [facility] and includes sequencing, rearrangement, and RNA analysis for the following 76 genes: AIP, ALK, APC, ATM, AXIN2, BAP1, BARD1, BMPR1A, BRCA1, BRCA2, BRIP1, CDC73, CDH1, CDK4, CDKN1B, CDKN2A, CEBPA, CHEK2, CTNNA1, DDX41, DICER1, ETV6, FH, FLCN, GATA2, LZTR1, MAX, MBD4, MEN1, MET, MLH1, MSH2, MSH3, MSH6, MUTYH, NF1, NF2, NTHL1, PALB2, PHOX2B, PMS2, POT1, PRKAR1A, PTCH1, PTEN, RAD51C, RAD51D, RB1, RET, RUNX1, SDHA, SDHAF2, SDHB, SDHC, SDHD, SMAD4, SMARCA4, SMARCB1, SMARCE1, STK11, SUFU, TMEM127, TP53, TSC1, TSC2, VHL, and WT1 (sequencing and deletion/duplication); EGFR, HOXB13, KIT, MITF, PDGFRA, POLD1, and POLE (sequencing only); EPCAM and GREM1 (deletion/duplication only).   The test report has been scanned into EPIC and is located under the Molecular Pathology section of the Results Review tab.  A portion of the result report is included below for reference. Genetic testing reported out on April 10, 2023.     Genetic testing did identify three variants of uncertain significance (VUS) - one in the BRCA1 gene called  5'UTR_EX20dup, a second in the PALB2 gene called  p.R37H (c.110G>A), and a third in the POLD1 gene called  p.G764S (c.2290G>A).  At this time, it is unknown if these variants are associated with increased cancer risk or if they are normal findings, but most variants such as these get reclassified to being inconsequential. They should not be used to make medical management decisions. With time, we  suspect the lab will determine the significance of these variants, if any. If we do learn more about them, we will try to contact Jennifer Harris to discuss it further.   Cancer Risks for CHEK2 I157T pathogenic variant: Jennifer Harris specific mutation (p.I157T) has been shown to confer a risk for breast cancer that is less than the breast cancer risk for other mutations in the CHEK2 gene. Thus, her mutation is classified as a low penetrance mutation. Studies in [place] of this low penetrance mutation estimated the lifetime risk for breast cancer among carriers to be increased by about 50% over the general population risk.  For those of average breast cancer risk, this low penetrance mutation in isolation is not expected to increase one's breast cancer risk >20%.  Males may be at increased risk of prostate cancer, but the exact risk figure is unknown at this time.   CHEK2 mutation are no longer thought to be associated with an increased risk for colon cancer.    Cancer risks in families with the same CHEK2 mutation can vary widely, suggesting that there are likely to be other factors involved that we Harris't understand yet that also contribute to the cancer risks associated with CHEK2 mutations. An individual's cancer risk and medical management are not determined by genetic test results alone. Overall cancer risk assessment incorporates additional factors, including personal medical history, family history, and any available genetic information that may result in a personalized plan for cancer prevention and surveillance.   Management Recommendations:  Breast Cancer Screening: Low penetrance variants in CHEK2 are not clinically actionable in isolation for breast cancer risk.  Breast surveillance should be based on personalized assessment including family history as a minimum  Colon Cancer Screening: General population screening is appropriate Manage based on personal and family history   Prostate  Cancer Screening: Conduct personalized risk assessment, including family history and other factors, to determine appropriate management  Can consider beginning annual PSA blood test and digital rectal exams at age 59.  This information is based on current understanding of the gene and may change in the future.   At this time, breast cancer risk models that take into account family history and low penetrance CHEK2 mutations are not available. Ms. Dinallo has an intermediate risk for breast cancer based on the Tyrer Cuzick risk model alone, which does not take low penetrance CHEK2 gene mutations into account.  Her lifetime risk for breast cancer based on Tyrer-Cuzick risk model is 17.5%.  This risk estimate can change over time and may be repeated to reflect new information in her personal or family history in the future.  Given the CHEK2 I157T gene mutation may also slightly elevate her breast cancer risk, it may be reasonable for Ms. Demarais to consider high risk breast screening.   For females with a greater than 20% lifetime risk of breast cancer, the [employer] (NCCN) recommends the following:  Clinical encounter every 6-12 months to begin when identified as being at increased risk, but not before age 38  Annual mammograms. Tomosynthesis is recommended starting 10 years  earlier than the youngest breast cancer diagnosis in the family or at age 68 (whichever comes first), but not before age 23   Annual breast MRI starting 10 years earlier than the youngest breast cancer diagnosis in the family or at age 48 (whichever comes first), but not before age 101      In addition to a yearly mammogram and physical exam by a healthcare provider, she should discuss the usefulness of an annual breast MRI with her primary care/GYN providers.  Ms. Topping is not interested in a referral to the high risk breast clinic at this time.  She knows to reach out in the future if she is  interested.    Implications for Family Members: Hereditary predisposition to cancer due to pathogenic variants in the CHEK2 gene has autosomal dominant inheritance. This means that an individual with a pathogenic variant has a 50% chance of passing the variant on to his/her offspring.  Family members can consider genetic testing for this familial pathogenic variant.  Caution should be used when testing individuals for low penetrance CHEK2 variants, as this variant may not be clinically actionable in isolation.  Survelliance should be personalized and include family history as a minimum. As there are generally no childhood cancer risks associated with pathogenic variants in the CHEK2 gene, individuals in the family are not recommended to have testing until they reach at least 48 years of age. Complimentary testing through [facility] for the familial variant is available for 90 days after the report date 04/11/2023.  They may contact our office at [phone] for more information or to schedule an appointment. Family members who live outside of the area are encouraged to find a genetic counselor in their area by visiting: [email or web address].   We recommend her mother, who has a personal history of ovarian cancer, and her paternal relatives with a history of breast cancer speak with their providers about comprehensive genetic testing.  Ms. Gelb can let us know if we can be of any assistance in coordinating genetic counseling for these relatives.   Resources: FORCE (Facing Our Risk of Cancer Empowered) is a resource for those with a hereditary predisposition to develop cancer.  FORCE provides information about risk reduction, advocacy, legislation, and clinical trials.  Additionally, FORCE provides a platform for collaboration and support; which includes: peer navigation, message boards, local support groups, a toll-free helpline, research registry and recruitment, advocate  training, published medical research, webinars, brochures, mastectomy photos, and more.  For more information, visit www.facingourrisk.org    Plan:  Ms. Berro should speak with her providers about a breast imaging plan for her, including consideration for high risk breast screening.  Family letter provided.     Hussam Muniz M. Rennie Plowman, MS, [facility] Genetic Counselor [email or web address] (P) [phone]

## 2023-04-19 ENCOUNTER — Ambulatory Visit (AMBULATORY_SURGERY_CENTER): Payer: Commercial Managed Care - PPO | Admitting: Gastroenterology

## 2023-04-19 ENCOUNTER — Encounter: Payer: Self-pay | Admitting: Gastroenterology

## 2023-04-19 ENCOUNTER — Encounter: Payer: Self-pay | Admitting: Nurse Practitioner

## 2023-04-19 VITALS — BP 109/69 | HR 64 | Temp 99.1°F | Resp 11 | Ht 66.0 in | Wt 138.0 lb

## 2023-04-19 DIAGNOSIS — Z1211 Encounter for screening for malignant neoplasm of colon: Secondary | ICD-10-CM | POA: Diagnosis not present

## 2023-04-19 DIAGNOSIS — K648 Other hemorrhoids: Secondary | ICD-10-CM | POA: Diagnosis not present

## 2023-04-19 DIAGNOSIS — K5730 Diverticulosis of large intestine without perforation or abscess without bleeding: Secondary | ICD-10-CM | POA: Diagnosis not present

## 2023-04-19 DIAGNOSIS — K644 Residual hemorrhoidal skin tags: Secondary | ICD-10-CM

## 2023-04-19 MED ORDER — SODIUM CHLORIDE 0.9 % IV SOLN: 500.0000 mL | Freq: Once | INTRAVENOUS | Status: DC

## 2023-04-19 NOTE — Progress Notes (Signed)
Pt's states no medical or surgical changes since previsit or office visit.

## 2023-04-19 NOTE — Progress Notes (Signed)
Sedate, gd SR, tolerated procedure well, VSS, report to RN

## 2023-04-19 NOTE — Progress Notes (Signed)
[facility] History and Physical   Primary Care Physician:  Babs Sciara, MD   Reason for Procedure:  Colorectal cancer screening  Plan:    Screening colonoscopy with possible interventions as needed     HPI: Jennifer Harris is a very pleasant 48 y.o. female here for screening colonoscopy. Denies any nausea, vomiting, abdominal pain, melena or bright red blood per rectum  The risks and benefits as well as alternatives of endoscopic procedure(s) have been discussed and reviewed. All questions answered. The patient agrees to proceed.    Past Medical History:  Diagnosis Date   BV (bacterial vaginosis) 05/29/2013   CHEK2 gene mutation positive 04/15/2023   Hypertension    IBS (irritable bowel syndrome)    Other and unspecified ovarian cyst 05/29/2013   RLQ abdominal pain 05/29/2013   Vaginal discharge 07/06/2013   Vitamin D deficiency     Past Surgical History:  Procedure Laterality Date   FLEXIBLE SIGMOIDOSCOPY  03/26/2008   WISDOM TOOTH EXTRACTION      Prior to Admission medications   Medication Sig Start Date End Date Taking? Authorizing Provider  BioGaia Probiotic (BIOGAIA/GERBER SOOTHE) LIQD Take 5 drops by mouth daily at 8 pm.   Yes [provider]  fexofenadine (ALLEGRA) 180 MG tablet Take 180 mg by mouth daily.   Yes [provider]  losartan (COZAAR) 100 MG tablet Take 1 tablet (100 mg total) by mouth daily. 01/05/23  Yes Campbell Riches, NP  norethindrone (ORTHO MICRONOR) 0.35 MG tablet Take 1 tablet (0.35 mg total) by mouth daily. 01/04/23  Yes Campbell Riches, NP  acetaminophen (TYLENOL) 500 MG tablet  04/01/18   [provider]  ondansetron (ZOFRAN-ODT) 8 MG disintegrating tablet Dissolve 1 tablet by mouth every 8-12 hours as needed for nausea 08/11/20     valACYclovir (VALTREX) 1000 MG tablet TAKE AS DIRECTED 07/09/18   Adline Potter, NP    Current Outpatient Medications  Medication Sig Dispense Refill    BioGaia Probiotic (BIOGAIA/GERBER SOOTHE) LIQD Take 5 drops by mouth daily at 8 pm.     fexofenadine (ALLEGRA) 180 MG tablet Take 180 mg by mouth daily.     losartan (COZAAR) 100 MG tablet Take 1 tablet (100 mg total) by mouth daily. 90 tablet 1   norethindrone (ORTHO MICRONOR) 0.35 MG tablet Take 1 tablet (0.35 mg total) by mouth daily. 84 tablet 3   acetaminophen (TYLENOL) 500 MG tablet  (Patient not taking: Reported on 04/19/2023)     ondansetron (ZOFRAN-ODT) 8 MG disintegrating tablet Dissolve 1 tablet by mouth every 8-12 hours as needed for nausea 16 tablet 0   valACYclovir (VALTREX) 1000 MG tablet TAKE AS DIRECTED 90 tablet 3   Current Facility-Administered Medications  Medication Dose Route Frequency Provider Last Rate Last Admin   0.9 %  sodium chloride infusion  500 mL Intravenous Once Napoleon Form, MD        Allergies as of 04/19/2023   (No Known Allergies)    Family History  Problem Relation Age of Onset   Ovarian cancer Mother 33   COPD Father    Hypertension Brother    Kidney cancer Maternal Aunt 42 - 89   Breast cancer Paternal Aunt 75   Breast cancer Paternal Aunt 33   Uterine cancer Paternal Aunt 66 - 5   Leukemia Paternal Uncle 41   Stroke Maternal Grandmother    Breast cancer Maternal Grandmother    Breast cancer Paternal Grandmother    ADD /  ADHD Son    Esophageal cancer Cousin        maternal first cousin, smoked   Colon cancer Neg Hx    Colon polyps Neg Hx    Rectal cancer Neg Hx    Stomach cancer Neg Hx     Social History   Socioeconomic History   Marital status: Married    Spouse name: Not on file   Number of children: Not on file   Years of education: Not on file   Highest education level: Not on file  Occupational History   Not on file  Tobacco Use   Smoking status: Never   Smokeless tobacco: Never  Vaping Use   Vaping status: Never Used  Substance and Sexual Activity   Alcohol use: No   Drug use: No   Sexual activity: Yes     Birth control/protection: Pill  Other Topics Concern   Not on file  Social History Narrative   Not on file   Social Drivers of Health   Financial Resource Strain: Low Risk  (12/17/2019)   Overall Financial Resource Strain (CARDIA)    Difficulty of Paying Living Expenses: Not hard at all  Food Insecurity: No Food Insecurity (12/17/2019)   Hunger Vital Sign    Worried About Running Out of Food in the Last Year: Never true    Ran Out of Food in the Last Year: Never true  Transportation Needs: No Transportation Needs (12/17/2019)   PRAPARE - Administrator, Civil Service (Medical): No    Lack of Transportation (Non-Medical): No  Physical Activity: Inactive (12/17/2019)   Exercise Vital Sign    Days of Exercise per Week: 0 days    Minutes of Exercise per Session: 0 min  Stress: No Stress Concern Present (12/17/2019)   [employer] of Occupational Health - Occupational Stress Questionnaire    Feeling of Stress : Not at all  Social Connections: Moderately Integrated (12/17/2019)   Social Connection and Isolation Panel [NHANES]    Frequency of Communication with Friends and Family: More than three times a week    Frequency of Social Gatherings with Friends and Family: Once a week    Attends Religious Services: More than 4 times per year    Active Member of [employer] or Organizations: No    Attends Banker Meetings: Never    Marital Status: Married  Catering manager Violence: Not At Risk (12/17/2019)   Humiliation, Afraid, Rape, and Kick questionnaire    Fear of Current or Ex-Partner: No    Emotionally Abused: No    Physically Abused: No    Sexually Abused: No    Review of Systems:  All other review of systems negative except as mentioned in the HPI.  Physical Exam: Vital signs in last 24 hours: BP 115/63   Pulse 62   Temp 99.1 F (37.3 C) (Skin)   Ht 5\' 6"  (1.676 m)   Wt 138 lb (62.6 kg)   SpO2 100%   BMI 22.27 kg/m  General:   Alert, NAD Lungs:   Clear .   Heart:  Regular rate and rhythm Abdomen:  Soft, nontender and nondistended. Neuro/Psych:  Alert and cooperative. Normal mood and affect. A and O x 3  Reviewed labs, radiology imaging, old records and pertinent past GI work up  Patient is appropriate for planned procedure(s) and anesthesia in an ambulatory setting   K. Scherry Ran , MD [phone]

## 2023-04-19 NOTE — Op Note (Signed)
[facility] Patient Name: Jennifer Harris Procedure Date: 04/19/2023 9:06 AM MRN: [id number] Endoscopist: Napoleon Form , MD, [id number] Age: 48 Referring MD:  Date of Birth: Apr 07, 1975 Gender: Female Account #: [id number] Procedure:                Colonoscopy Indications:              Screening for colorectal malignant neoplasm Medicines:                Monitored Anesthesia Care Procedure:                Pre-Anesthesia Assessment:                           - Prior to the procedure, a History and Physical                            was performed, and patient medications and                            allergies were reviewed. The patient's tolerance of                            previous anesthesia was also reviewed. The risks                            and benefits of the procedure and the sedation                            options and risks were discussed with the patient.                            All questions were answered, and informed consent                            was obtained. Prior Anticoagulants: The patient has                            taken no anticoagulant or antiplatelet agents. ASA                            Grade Assessment: I - A normal, healthy patient.                            After reviewing the risks and benefits, the patient                            was deemed in satisfactory condition to undergo the                            procedure.                           After obtaining informed consent, the colonoscope  was passed under direct vision. Throughout the                            procedure, the patient's blood pressure, pulse, and                            oxygen saturations were monitored continuously. The                            Olympus Scope SN: [id number] was introduced through                            the anus and advanced to the the cecum, identified                            by appendiceal  orifice and ileocecal valve. The                            colonoscopy was performed without difficulty. The                            patient tolerated the procedure well. The quality                            of the bowel preparation was good. The ileocecal                            valve, appendiceal orifice, and rectum were                            photographed. Scope In: 9:22:33 AM Scope Out: 9:38:21 AM Scope Withdrawal Time: 0 hours 11 minutes 21 seconds  Total Procedure Duration: 0 hours 15 minutes 48 seconds  Findings:                 The perianal and digital rectal examinations were                            normal.                           A few small-mouthed diverticula were found in the                            sigmoid colon.                           Non-bleeding external and internal hemorrhoids were                            found during retroflexion. The hemorrhoids were                            small. Complications:            No immediate complications. Estimated Blood Loss:  Estimated blood loss was minimal. Impression:               - Diverticulosis in the sigmoid colon.                           - Non-bleeding external and internal hemorrhoids.                           - No specimens collected. Recommendation:           - Patient has a contact number available for                            emergencies. The signs and symptoms of potential                            delayed complications were discussed with the                            patient. Return to normal activities tomorrow.                            Written discharge instructions were provided to the                            patient.                           - Resume previous diet.                           - Continue present medications.                           - Repeat colonoscopy in 10 years for surveillance. Napoleon Form, MD 04/19/2023 9:46:57 AM This report has been  signed electronically.

## 2023-04-19 NOTE — Patient Instructions (Signed)
Discharge instructions given. Handouts on Diverticulosis and Hemorrhoids. Resume previous medications. YOU HAD AN ENDOSCOPIC PROCEDURE TODAY AT THE [facility]:   Refer to the procedure report that was given to you for any specific questions about what was found during the examination.  If the procedure report does not answer your questions, please call your gastroenterologist to clarify.  If you requested that your care partner not be given the details of your procedure findings, then the procedure report has been included in a sealed envelope for you to review at your convenience later.  YOU SHOULD EXPECT: Some feelings of bloating in the abdomen. Passage of more gas than usual.  Walking can help get rid of the air that was put into your GI tract during the procedure and reduce the bloating. If you had a lower endoscopy (such as a colonoscopy or flexible sigmoidoscopy) you may notice spotting of blood in your stool or on the toilet paper. If you underwent a bowel prep for your procedure, you may not have a normal bowel movement for a few days.  Please Note:  You might notice some irritation and congestion in your nose or some drainage.  This is from the oxygen used during your procedure.  There is no need for concern and it should clear up in a day or so.  SYMPTOMS TO REPORT IMMEDIATELY:  Following lower endoscopy (colonoscopy or flexible sigmoidoscopy):  Excessive amounts of blood in the stool  Significant tenderness or worsening of abdominal pains  Swelling of the abdomen that is new, acute  Fever of 100F or higher   For urgent or emergent issues, a gastroenterologist can be reached at any hour by calling [phone]. Do not use MyChart messaging for urgent concerns.    DIET:  We do recommend a small meal at first, but then you may proceed to your regular diet.  Drink plenty of fluids but you should avoid alcoholic beverages for 24 hours.  ACTIVITY:  You should plan to  take it easy for the rest of today and you should NOT DRIVE or use heavy machinery until tomorrow (because of the sedation medicines used during the test).    FOLLOW UP: Our staff will call the number listed on your records the next business day following your procedure.  We will call around 7:15- 8:00 am to check on you and address any questions or concerns that you may have regarding the information given to you following your procedure. If we do not reach you, we will leave a message.     If any biopsies were taken you will be contacted by phone or by letter within the next 1-3 weeks.  Please call us at [phone] if you have not heard about the biopsies in 3 weeks.    SIGNATURES/CONFIDENTIALITY: You and/or your care partner have signed paperwork which will be entered into your electronic medical record.  These signatures attest to the fact that that the information above on your After Visit Summary has been reviewed and is understood.  Full responsibility of the confidentiality of this discharge information lies with you and/or your care-partner.

## 2023-04-22 ENCOUNTER — Telehealth: Payer: Self-pay

## 2023-04-22 NOTE — Telephone Encounter (Signed)
  Follow up Call-     04/19/2023    8:11 AM  Call back number  Post procedure Call Back phone  # [phone]  Permission to leave phone message Yes     Patient questions:  Do you have a fever, pain , or abdominal swelling? No. Pain Score  0 *  Have you tolerated food without any problems? Yes.    Have you been able to return to your normal activities? Yes.    Do you have any questions about your discharge instructions: Diet   No. Medications  No. Follow up visit  No.  Do you have questions or concerns about your Care? No.  Actions: * If pain score is 4 or above: No action needed, pain <4.

## 2023-04-27 ENCOUNTER — Other Ambulatory Visit (HOSPITAL_COMMUNITY): Payer: Self-pay

## 2023-04-29 ENCOUNTER — Other Ambulatory Visit (HOSPITAL_COMMUNITY): Payer: Self-pay

## 2023-04-30 ENCOUNTER — Other Ambulatory Visit (HOSPITAL_COMMUNITY): Payer: Self-pay

## 2023-06-12 IMAGING — US US BREAST*R* LIMITED INC AXILLA
1 series · 13 of 20 positions shown · non-contrast
Comparison: Previous exams.

CLINICAL DATA: Screening recall for possible right breast
asymmetry.

EXAM:
DIGITAL DIAGNOSTIC UNILATERAL RIGHT MAMMOGRAM WITH TOMOSYNTHESIS AND
CAD; ULTRASOUND RIGHT BREAST LIMITED
TECHNIQUE: Right digital diagnostic mammography and breast tomosynthesis was
performed. The images were evaluated with computer-aided detection.;
Targeted ultrasound examination of the right breast was performed

[Series 1: us breast*right* limited inc axilla · 0.07mm/px · 13 of 20 slices shown]
[im 1/20]
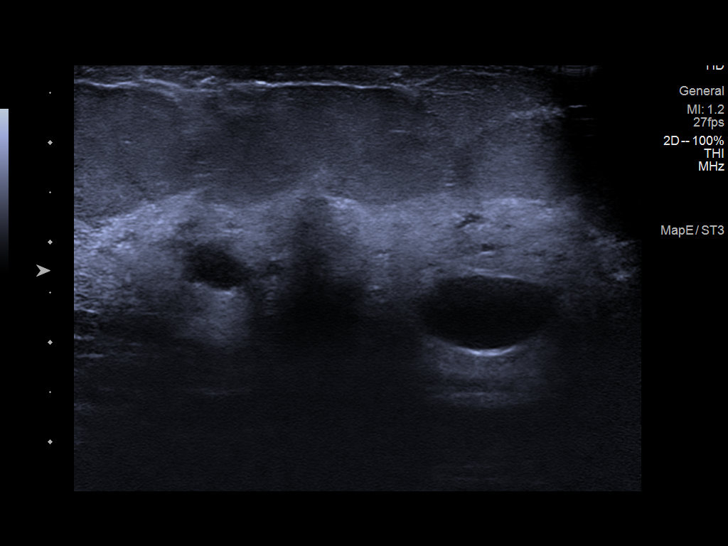
[im 3/20]
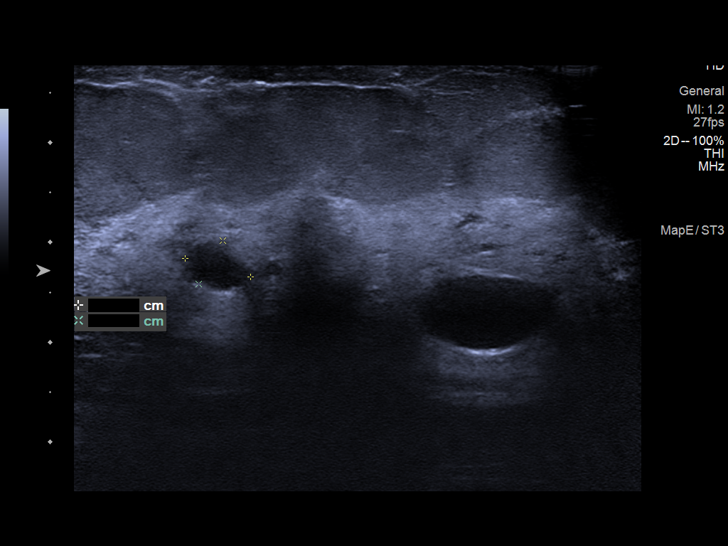
[im 4/20]
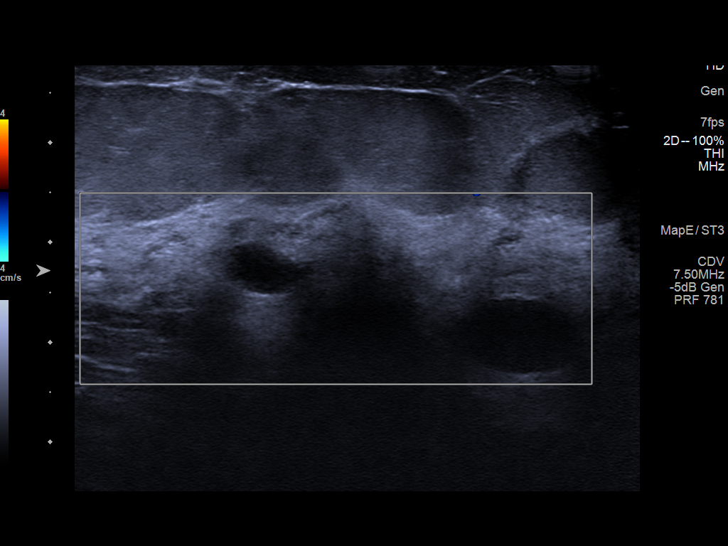
[im 6/20]
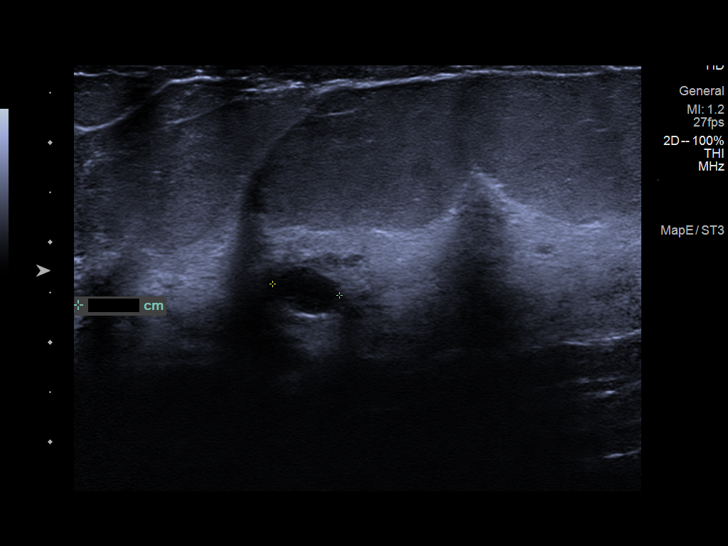
[im 7/20]
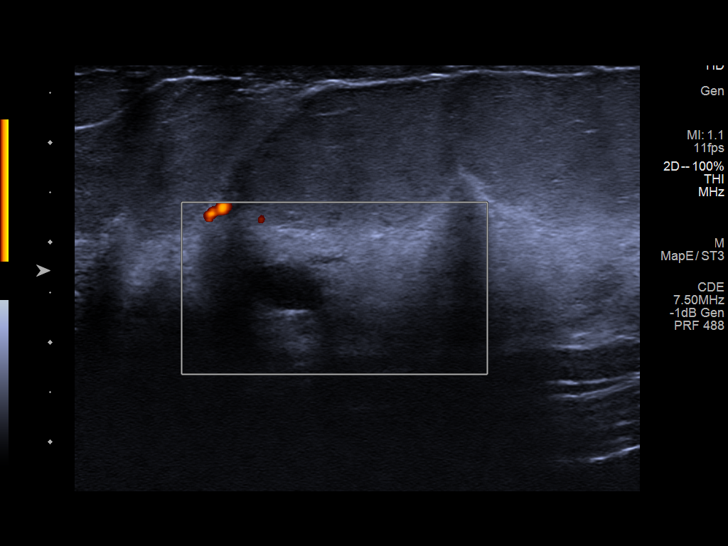
[im 9/20]
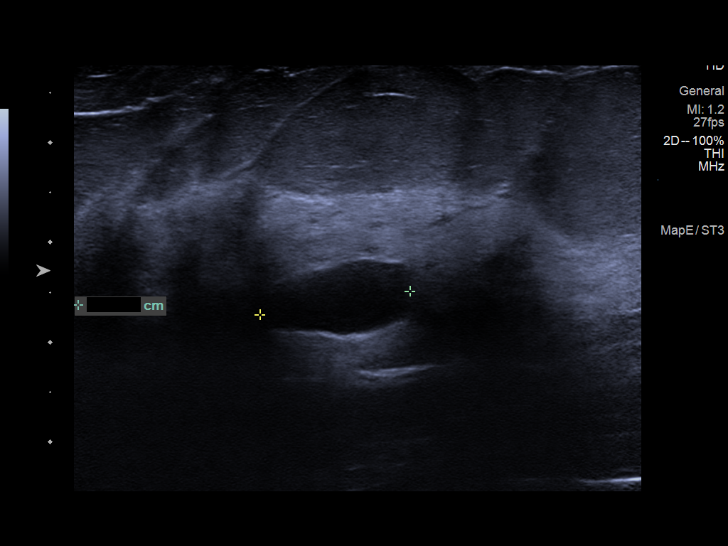
[im 11/20]
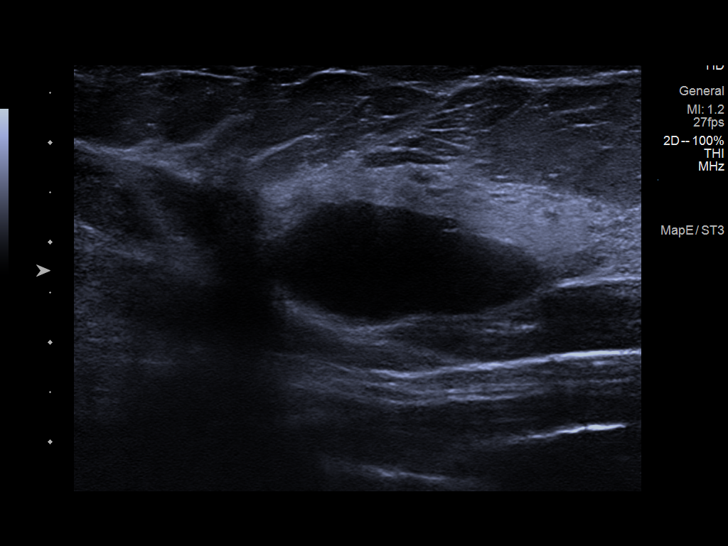
[im 12/20]
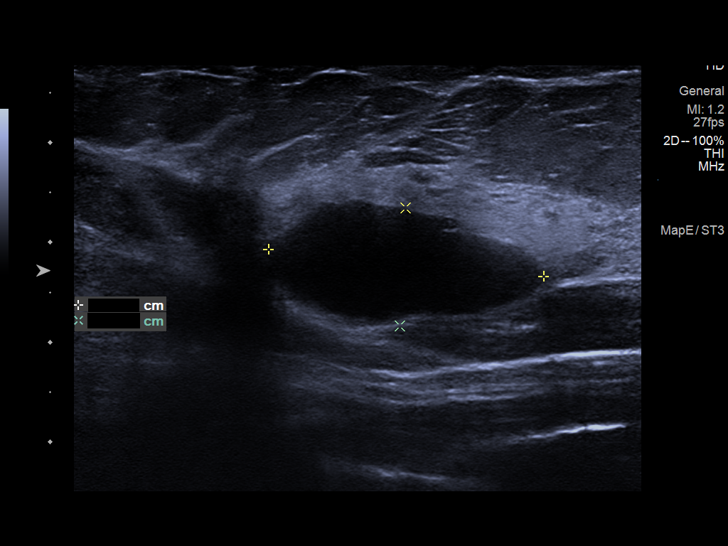
[im 14/20]
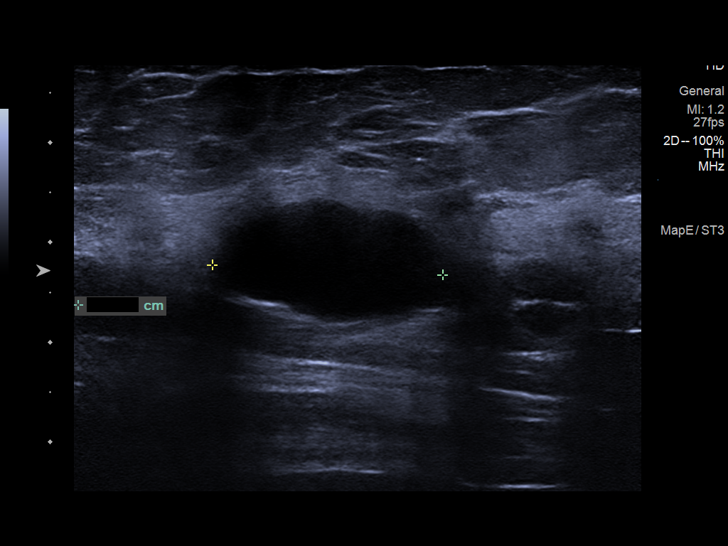
[im 15/20]
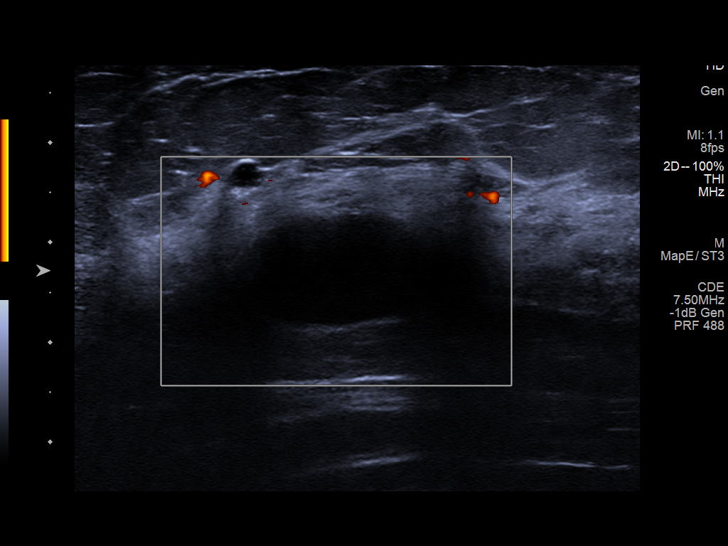
[im 17/20]
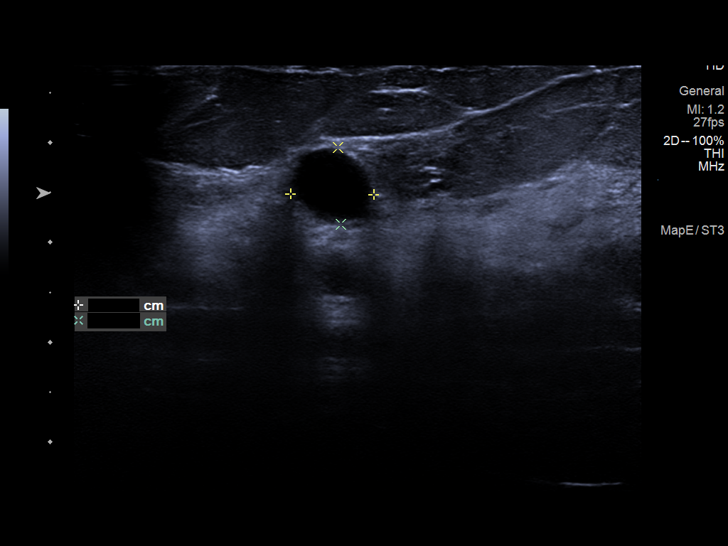
[im 18/20]
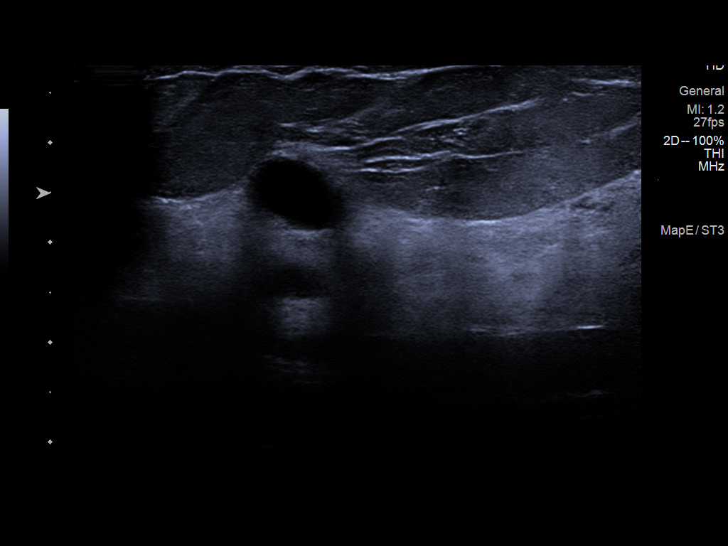
[im 20/20]
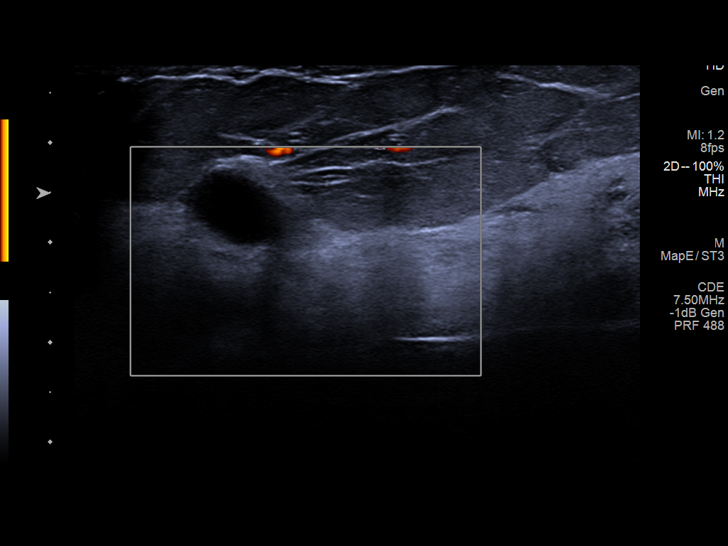

[13 of 20 positions shown; findings below may reference images not displayed]

ACR Breast Density Category d: The breast tissue is extremely dense,
which lowers the sensitivity of mammography.
FINDINGS: Additional tomograms were performed of the right breast. There are
at least 2 oval masses in the upper-outer posterior right breast,
the larger of which measures 2.8 cm and the smaller rounder more
superficial mass measures 1.1 cm.

Physical examination of the upper-outer right breast does not reveal
any definite discrete palpable masses.

Targeted ultrasound of the right breast was performed demonstrating
numerous cysts in the upper-outer quadrant of the right breast. A
cyst at 10 o'clock 4 cm from nipple measures 1.4 x 0.9 x 1.5 cm. An
additional cyst at 9 o'clock 4 cm from nipple measures 2.8 x 1.2 x
2.3 cm and an adjacent more superficially located cyst at 9 o'clock
4 cm from nipple measures 0.8 x 0.8 x 1 cm. These 2 cysts correspond
well with the masses seen in the right breast at mammography.
IMPRESSION: Right breast cysts.  No findings of malignancy in the right breast.

RECOMMENDATION:
Screening mammogram in one year.(Code:I4-A-KOV)

I have discussed the findings and recommendations with the patient.
If applicable, a reminder letter will be sent to the patient
regarding the next appointment.

BI-RADS CATEGORY  2: Benign.

## 2023-06-12 IMAGING — MG MM DIGITAL DIAGNOSTIC UNILAT*R* W/ TOMO W/ CAD
4 series · 4 of 12 positions shown · non-contrast
Comparison: Previous exams.

CLINICAL DATA: Screening recall for possible right breast
asymmetry.

EXAM:
DIGITAL DIAGNOSTIC UNILATERAL RIGHT MAMMOGRAM WITH TOMOSYNTHESIS AND
CAD; ULTRASOUND RIGHT BREAST LIMITED
TECHNIQUE: Right digital diagnostic mammography and breast tomosynthesis was
performed. The images were evaluated with computer-aided detection.;
Targeted ultrasound examination of the right breast was performed

[R ML synth-2D]
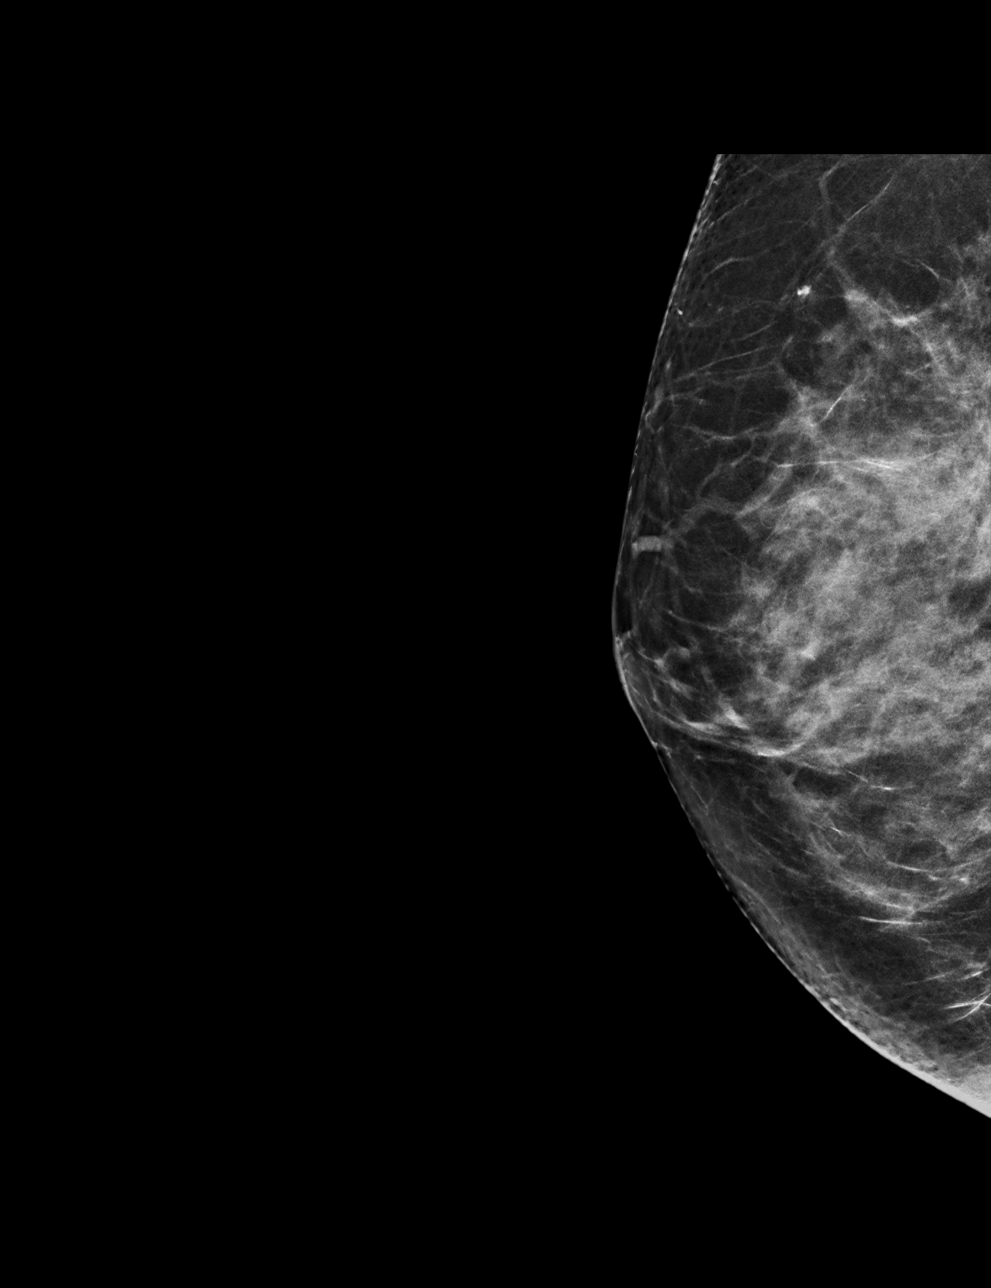

[R CC synth-2D]
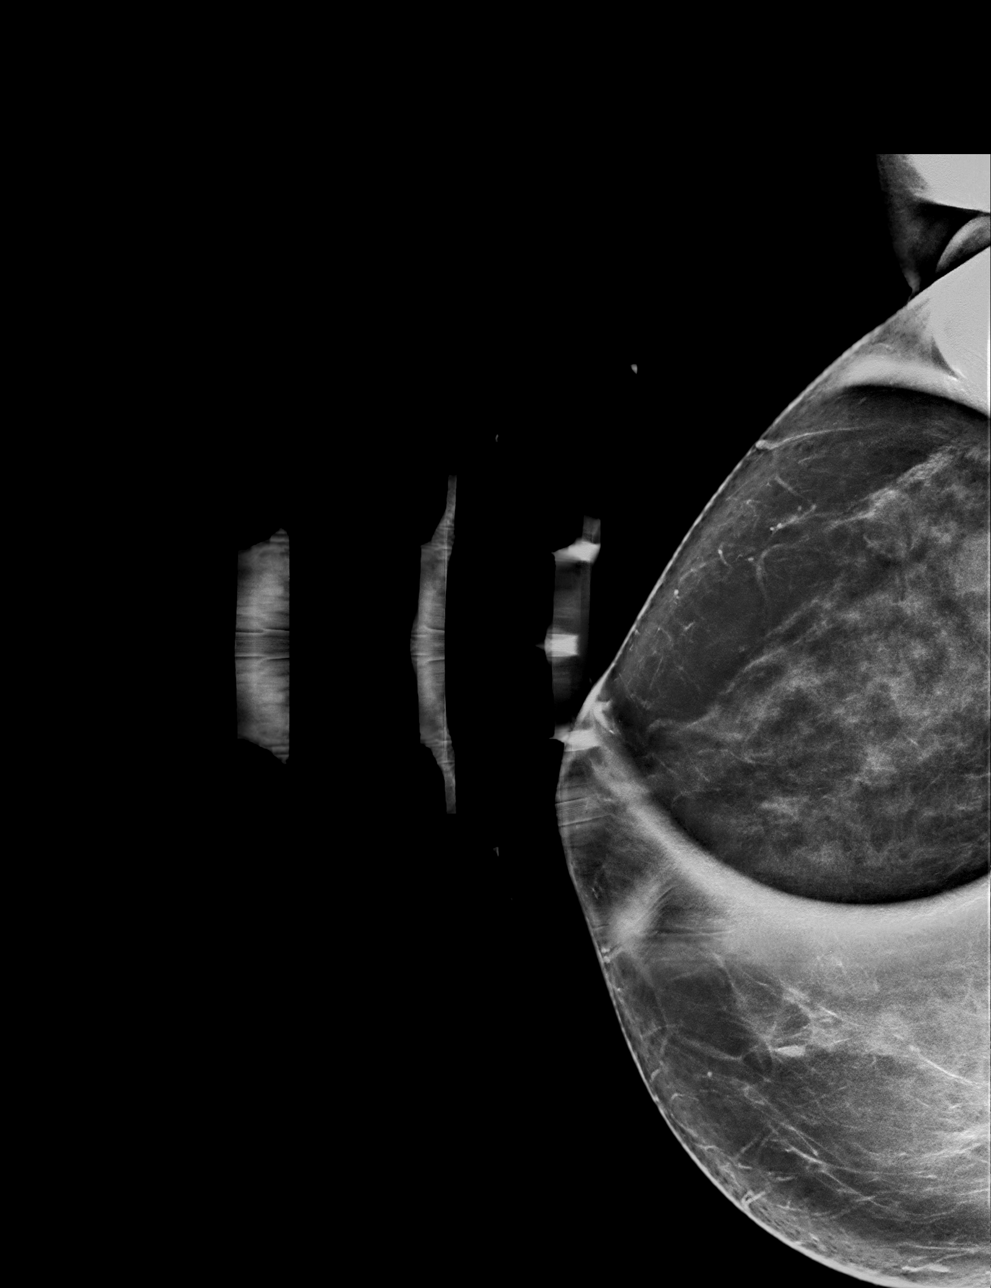

[R CC tomo · tomo slice 38/75.0]
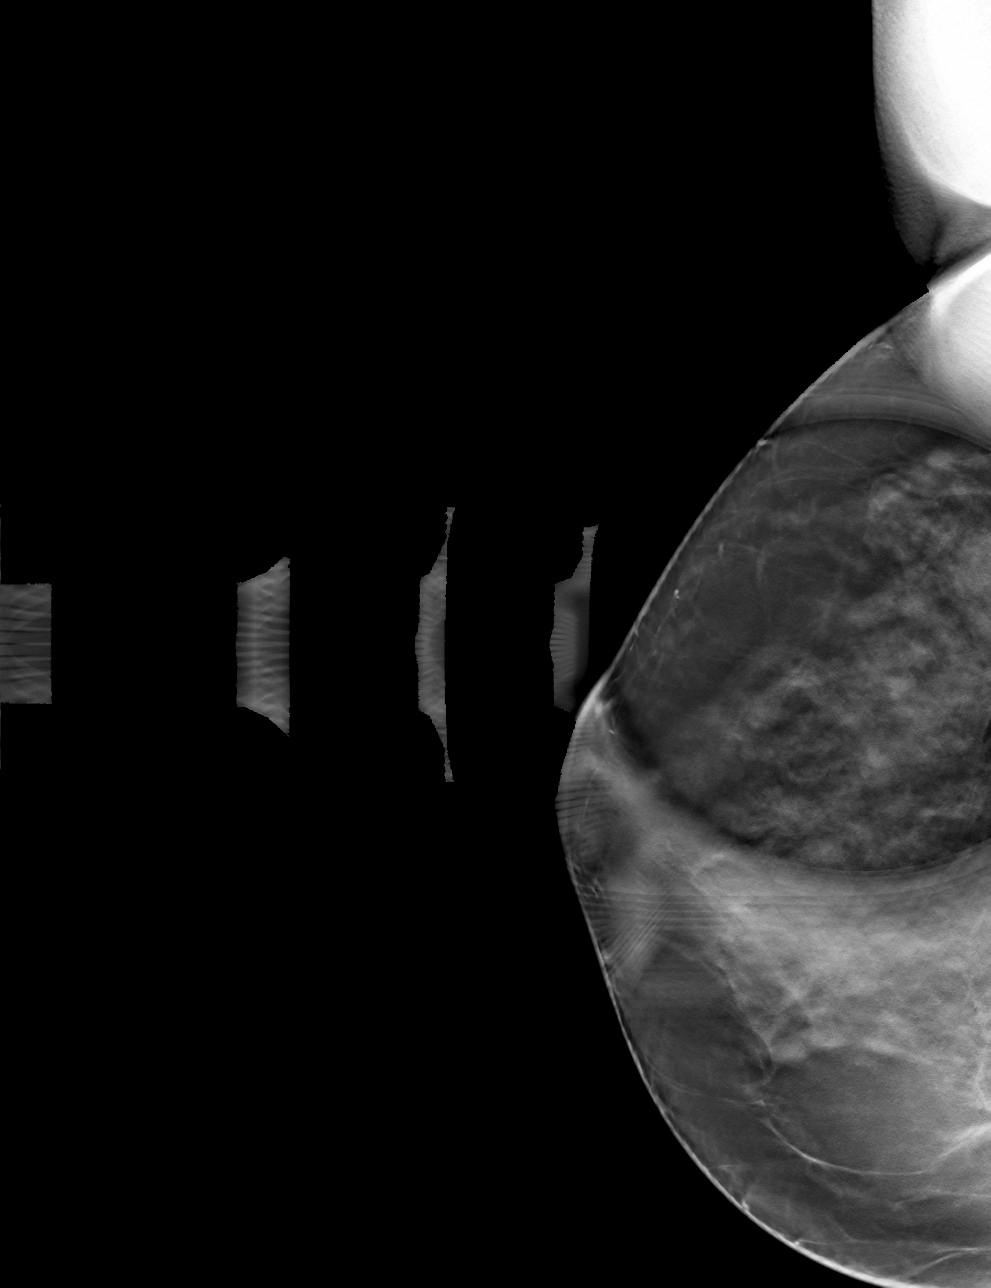

[R ML tomo · tomo slice 35/68.0]
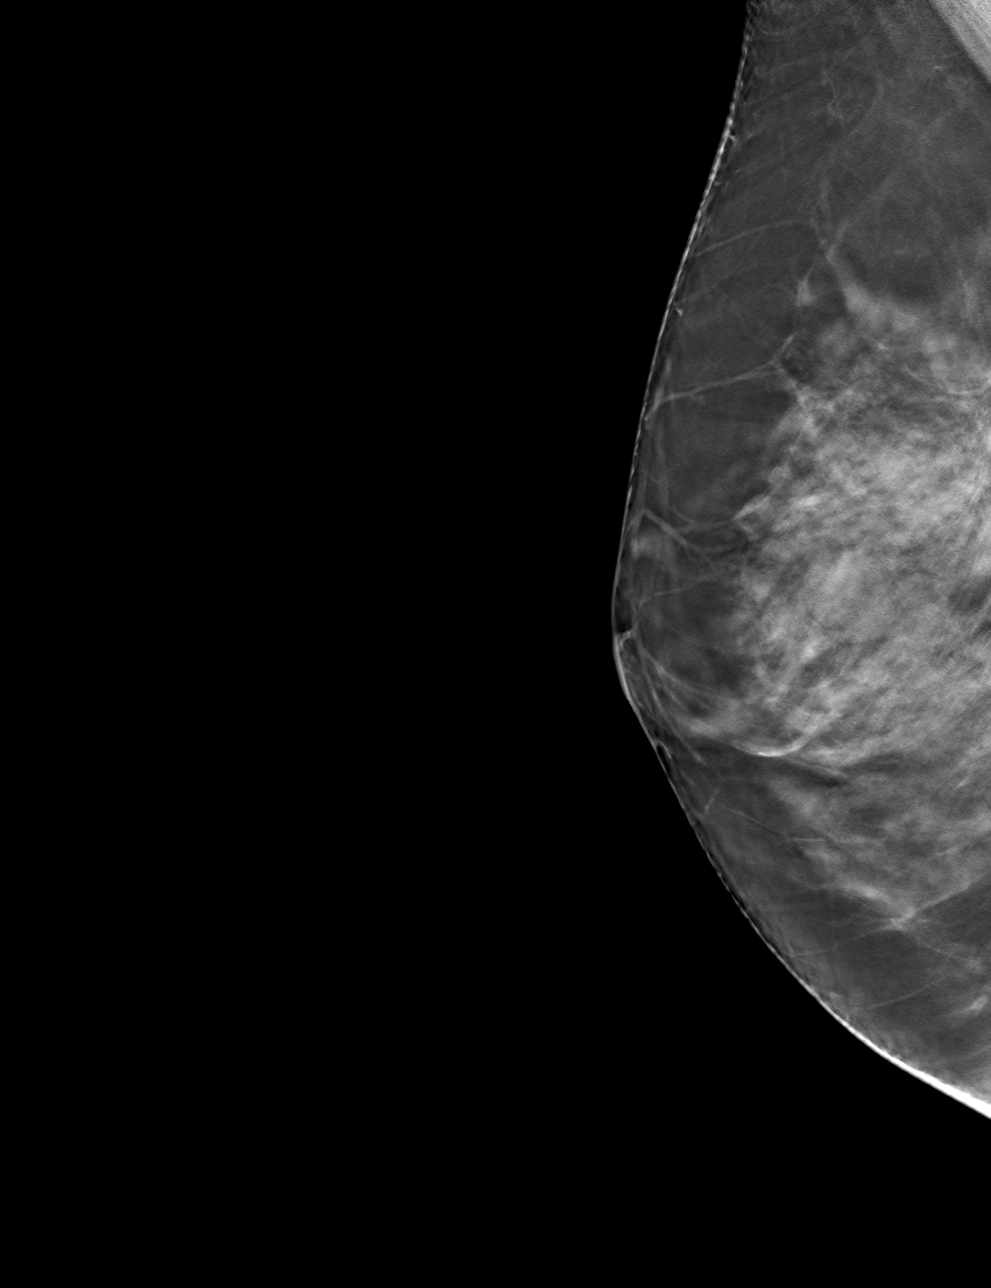

[4 of 12 positions shown; findings below may reference images not displayed]

ACR Breast Density Category d: The breast tissue is extremely dense,
which lowers the sensitivity of mammography.
FINDINGS: Additional tomograms were performed of the right breast. There are
at least 2 oval masses in the upper-outer posterior right breast,
the larger of which measures 2.8 cm and the smaller rounder more
superficial mass measures 1.1 cm.

Physical examination of the upper-outer right breast does not reveal
any definite discrete palpable masses.

Targeted ultrasound of the right breast was performed demonstrating
numerous cysts in the upper-outer quadrant of the right breast. A
cyst at 10 o'clock 4 cm from nipple measures 1.4 x 0.9 x 1.5 cm. An
additional cyst at 9 o'clock 4 cm from nipple measures 2.8 x 1.2 x
2.3 cm and an adjacent more superficially located cyst at 9 o'clock
4 cm from nipple measures 0.8 x 0.8 x 1 cm. These 2 cysts correspond
well with the masses seen in the right breast at mammography.
IMPRESSION: Right breast cysts.  No findings of malignancy in the right breast.

RECOMMENDATION:
Screening mammogram in one year.(Code:I4-A-KOV)

I have discussed the findings and recommendations with the patient.
If applicable, a reminder letter will be sent to the patient
regarding the next appointment.

BI-RADS CATEGORY  2: Benign.

## 2023-07-02 ENCOUNTER — Encounter: Payer: Self-pay | Admitting: Family Medicine

## 2023-07-02 ENCOUNTER — Ambulatory Visit: Admitting: Family Medicine

## 2023-07-02 ENCOUNTER — Other Ambulatory Visit (HOSPITAL_COMMUNITY): Payer: Self-pay

## 2023-07-02 VITALS — BP 122/69 | HR 63 | Temp 98.2°F | Ht 66.0 in | Wt 137.0 lb

## 2023-07-02 DIAGNOSIS — R21 Rash and other nonspecific skin eruption: Secondary | ICD-10-CM

## 2023-07-02 DIAGNOSIS — M25541 Pain in joints of right hand: Secondary | ICD-10-CM | POA: Diagnosis not present

## 2023-07-02 MED ORDER — LOSARTAN POTASSIUM 100 MG PO TABS
100.0000 mg | ORAL_TABLET | Freq: Every day | ORAL | 1 refills | Status: DC
  Filled 2023-07-02 – 2023-07-28 (×2): qty 90, 90d supply, fill #0
  Filled 2023-10-22: qty 90, 90d supply, fill #1

## 2023-07-02 NOTE — Progress Notes (Signed)
   Subjective:    Patient ID: Jennifer Harris, female    DOB: 07-Aug-1975, 48 y.o.   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 Patient also relates some joint swelling inflammation tenderness that occurred in her knuckles cause pain and discomfort with gripping She does relate some family history of autoimmune disease She denies any fevers or chills   Review of Systems     Objective:   Physical Exam  General-in no acute distress Eyes-no discharge Lungs-respiratory rate normal, CTA CV-no murmurs,RRR Extremities skin warm dry no edema Neuro grossly normal Behavior normal, alert Discoid lesion noted on the left lower leg does not appear cancerous No swelling of the joints currently but she has decreased flexion ability of the right thumb      Assessment & Plan:  1. Discoid rash (Primary) Lab work ordered If lab work negative consider dermatology for possible skin biopsy - Rheumatoid factor - ANA - Sedimentation Rate - C-reactive protein  2. Arthralgia of right hand Lab work ordered May need rheumatology consult Patient currently taking Tylenol for discomfort Given that she had redness in the joints with swelling-she did show me pictures from her phone-it does appear that there could be inflammatory arthritis going on - Rheumatoid factor - ANA - Sedimentation Rate - C-reactive protein  Await lab work

## 2023-07-03 DIAGNOSIS — M25541 Pain in joints of right hand: Secondary | ICD-10-CM | POA: Diagnosis not present

## 2023-07-03 DIAGNOSIS — R21 Rash and other nonspecific skin eruption: Secondary | ICD-10-CM | POA: Diagnosis not present

## 2023-07-04 LAB — SEDIMENTATION RATE: Sed Rate: 10 mm/h (ref 0–32)

## 2023-07-04 LAB — C-REACTIVE PROTEIN: CRP: 1 mg/L (ref 0–10)

## 2023-07-04 LAB — ANA: Anti Nuclear Antibody (ANA): NEGATIVE

## 2023-07-10 ENCOUNTER — Encounter: Payer: Self-pay | Admitting: Family Medicine

## 2023-07-10 NOTE — Telephone Encounter (Signed)
 Nurses Please send in Kenalog cream, 30 g, apply thin amount twice daily to the rash Also I would recommend dermatology consult for this rash   You may also forward the following message to [facility] The blood work came back negative for inflammatory markers.  I am still concerned about your situation given that you had redness and swelling in your joints.  Are you having any joint symptoms currently?  Certainly if you are having ongoing joint discomforts or swelling in the joints I would recommend a consultation with rheumatology  As for the rash we will try a thin layer of steroid cream twice daily but also believe it would be in your best interest for us  to set you up with a dermatology consult if this does not get better with the cream they will more than likely will talk about doing a skin biopsy looking for other potential causes  I have asked our staff to go ahead with a consultation with dermatology  Please give me some follow-up regarding your joints and whether or not you are having aching stiffness redness or swelling with your joints   Thanks-Dr. Geralyn Knee

## 2023-07-11 ENCOUNTER — Other Ambulatory Visit (HOSPITAL_COMMUNITY): Payer: Self-pay

## 2023-07-11 DIAGNOSIS — L400 Psoriasis vulgaris: Secondary | ICD-10-CM | POA: Diagnosis not present

## 2023-07-11 DIAGNOSIS — R21 Rash and other nonspecific skin eruption: Secondary | ICD-10-CM | POA: Diagnosis not present

## 2023-07-11 DIAGNOSIS — L308 Other specified dermatitis: Secondary | ICD-10-CM | POA: Diagnosis not present

## 2023-07-11 MED ORDER — CLOBETASOL PROPIONATE 0.05 % EX CREA
TOPICAL_CREAM | CUTANEOUS | 3 refills | Status: AC
  Filled 2023-07-11: qty 60, 30d supply, fill #0

## 2023-07-20 ENCOUNTER — Other Ambulatory Visit (HOSPITAL_COMMUNITY): Payer: Self-pay

## 2023-07-25 DIAGNOSIS — L2089 Other atopic dermatitis: Secondary | ICD-10-CM | POA: Diagnosis not present

## 2023-07-29 ENCOUNTER — Other Ambulatory Visit (HOSPITAL_COMMUNITY): Payer: Self-pay

## 2023-07-29 ENCOUNTER — Other Ambulatory Visit: Payer: Self-pay

## 2023-08-07 ENCOUNTER — Other Ambulatory Visit: Payer: Self-pay

## 2023-08-07 ENCOUNTER — Other Ambulatory Visit (HOSPITAL_COMMUNITY): Payer: Self-pay

## 2023-08-07 MED ORDER — OPZELURA 1.5 % EX CREA
TOPICAL_CREAM | Freq: Two times a day (BID) | CUTANEOUS | 3 refills | Status: AC
  Filled 2023-08-07: qty 60, 30d supply, fill #0
  Filled 2024-03-21 – 2024-03-25 (×2): qty 60, 30d supply, fill #1

## 2023-08-08 ENCOUNTER — Other Ambulatory Visit (HOSPITAL_COMMUNITY): Payer: Self-pay

## 2023-10-12 ENCOUNTER — Other Ambulatory Visit (HOSPITAL_COMMUNITY): Payer: Self-pay

## 2023-10-14 ENCOUNTER — Other Ambulatory Visit: Payer: Self-pay

## 2023-10-22 ENCOUNTER — Other Ambulatory Visit (HOSPITAL_COMMUNITY): Payer: Self-pay

## 2023-12-26 ENCOUNTER — Other Ambulatory Visit: Payer: Self-pay | Admitting: Nurse Practitioner

## 2023-12-26 ENCOUNTER — Other Ambulatory Visit (HOSPITAL_BASED_OUTPATIENT_CLINIC_OR_DEPARTMENT_OTHER): Payer: Self-pay

## 2023-12-27 ENCOUNTER — Other Ambulatory Visit (HOSPITAL_BASED_OUTPATIENT_CLINIC_OR_DEPARTMENT_OTHER): Payer: Self-pay

## 2023-12-27 MED ORDER — NORETHINDRONE 0.35 MG PO TABS
1.0000 | ORAL_TABLET | Freq: Every day | ORAL | 3 refills | Status: AC
  Filled 2023-12-27: qty 84, 84d supply, fill #0
  Filled 2024-03-25: qty 84, 84d supply, fill #1

## 2023-12-30 ENCOUNTER — Other Ambulatory Visit (HOSPITAL_BASED_OUTPATIENT_CLINIC_OR_DEPARTMENT_OTHER): Payer: Self-pay

## 2024-01-27 ENCOUNTER — Other Ambulatory Visit (HOSPITAL_BASED_OUTPATIENT_CLINIC_OR_DEPARTMENT_OTHER): Payer: Self-pay

## 2024-01-27 ENCOUNTER — Other Ambulatory Visit: Payer: Self-pay | Admitting: Family Medicine

## 2024-01-28 ENCOUNTER — Other Ambulatory Visit (HOSPITAL_BASED_OUTPATIENT_CLINIC_OR_DEPARTMENT_OTHER): Payer: Self-pay

## 2024-01-28 ENCOUNTER — Other Ambulatory Visit (HOSPITAL_COMMUNITY): Payer: Self-pay

## 2024-01-28 MED ORDER — LOSARTAN POTASSIUM 100 MG PO TABS
100.0000 mg | ORAL_TABLET | Freq: Every day | ORAL | 1 refills | Status: AC
  Filled 2024-01-28 (×2): qty 90, 90d supply, fill #0
  Filled 2024-04-22: qty 90, 90d supply, fill #1

## 2024-02-28 LAB — LAB REPORT - SCANNED
A1c: 5.4
EGFR: 101

## 2024-03-02 ENCOUNTER — Ambulatory Visit (INDEPENDENT_AMBULATORY_CARE_PROVIDER_SITE_OTHER): Admitting: Nurse Practitioner

## 2024-03-02 ENCOUNTER — Other Ambulatory Visit (HOSPITAL_BASED_OUTPATIENT_CLINIC_OR_DEPARTMENT_OTHER): Payer: Self-pay

## 2024-03-02 ENCOUNTER — Encounter: Payer: Self-pay | Admitting: Nurse Practitioner

## 2024-03-02 VITALS — BP 126/71 | HR 71 | Temp 98.1°F | Ht 66.0 in | Wt 141.2 lb

## 2024-03-02 DIAGNOSIS — Z1231 Encounter for screening mammogram for malignant neoplasm of breast: Secondary | ICD-10-CM

## 2024-03-02 DIAGNOSIS — E559 Vitamin D deficiency, unspecified: Secondary | ICD-10-CM

## 2024-03-02 DIAGNOSIS — I10 Essential (primary) hypertension: Secondary | ICD-10-CM

## 2024-03-02 DIAGNOSIS — Z01419 Encounter for gynecological examination (general) (routine) without abnormal findings: Secondary | ICD-10-CM

## 2024-03-02 MED ORDER — VITAMIN D (ERGOCALCIFEROL) 1.25 MG (50000 UNIT) PO CAPS
50000.0000 [IU] | ORAL_CAPSULE | ORAL | 1 refills | Status: AC
  Filled 2024-03-02: qty 12, 84d supply, fill #0

## 2024-03-02 NOTE — Progress Notes (Signed)
 Subjective:    Patient ID: Jennifer Harris, female    DOB: 1975-11-25, 48 y.o.   MRN: [id number]  HPI The patient comes in today for a wellness visit.    A review of their health history was completed.  A review of medications was also completed.  Any needed refills; none  Eating habits: eating whole grains; no sugary beverages, mostly water  Regular exercise: limited; has sedentary job  Specialist pt sees on regular basis: Dermatology   Preventative health issues were discussed.   Additional concerns: has labs from 02/25/24 for review  Married, same female sexual partner; no menses or bleeding on Norethindrone . Regular dental and vision exams.  No issues with sleep.   Review of Systems  Constitutional:  Negative for activity change and appetite change.  HENT:  Negative for sore throat and trouble swallowing.   Respiratory:  Negative for cough, chest tightness, shortness of breath and wheezing.   Cardiovascular:  Negative for chest pain.  Gastrointestinal:  Negative for abdominal distention, abdominal pain, constipation, diarrhea, nausea and vomiting.  Genitourinary:  Negative for difficulty urinating, dysuria, enuresis, frequency, menstrual problem, pelvic pain, urgency and vaginal discharge.      03/02/2024    1:25 PM  Depression screen PHQ 2/9  Decreased Interest 0  Down, Depressed, Hopeless 0  PHQ - 2 Score 0  Altered sleeping 0  Tired, decreased energy 0  Change in appetite 0  Feeling bad or failure about yourself  0  Trouble concentrating 0  Moving slowly or fidgety/restless 0  Suicidal thoughts 0  PHQ-9 Score 0  Difficult doing work/chores Not difficult at all      03/02/2024    1:25 PM 07/02/2023    4:05 PM 01/04/2023    1:22 PM 12/17/2019    3:59 PM  GAD 7 : Generalized Anxiety Score  Nervous, Anxious, on Edge 0 0 0 0  Control/stop worrying 0 0 0 0  Worry too much - different things 0 0 0 0  Trouble relaxing 0 0 0 0  Restless 0 0 0 0  Easily  annoyed or irritable 0 0 0 0  Afraid - awful might happen 0 0 0 0  Total GAD 7 Score 0 0 0 0  Anxiety Difficulty Not difficult at all Not difficult at all Not difficult at all     Social History   Tobacco Use   Smoking status: Never   Smokeless tobacco: Never  Vaping Use   Vaping status: Never Used  Substance Use Topics   Alcohol use: No   Drug use: No        Objective:   Physical Exam Vitals and nursing note reviewed.  Constitutional:      General: She is not in acute distress.    Appearance: She is well-developed.  Neck:     Thyroid : No thyromegaly.     Trachea: No tracheal deviation.     Comments: Thyroid  non tender to palpation. No mass or goiter noted.  Cardiovascular:     Rate and Rhythm: Normal rate and regular rhythm.     Heart sounds: Normal heart sounds. No murmur heard. Pulmonary:     Effort: Pulmonary effort is normal.     Breath sounds: Normal breath sounds.  Chest:  Breasts:    Right: No swelling, inverted nipple, mass, skin change or tenderness.     Left: No swelling, inverted nipple, mass, skin change or tenderness.  Abdominal:     General: There is no  distension.     Palpations: Abdomen is soft.     Tenderness: There is no abdominal tenderness.  Musculoskeletal:     Cervical back: Normal range of motion and neck supple.  Lymphadenopathy:     Cervical: No cervical adenopathy.     Upper Body:     Right upper body: No supraclavicular, axillary or pectoral adenopathy.     Left upper body: No supraclavicular, axillary or pectoral adenopathy.  Skin:    General: Skin is warm and dry.  Neurological:     Mental Status: She is alert and oriented to person, place, and time.  Psychiatric:        Mood and Affect: Mood normal.        Behavior: Behavior normal.        Thought Content: Thought content normal.        Judgment: Judgment normal.    Today's Vitals   03/02/24 1318  BP: 126/71  Pulse: 71  Temp: 98.1 F (36.7 C)  SpO2: 97%  Weight: 141  lb 4 oz (64.1 kg)  Height: 5' 6 (1.676 m)   Body mass index is 22.8 kg/m.  See scanned lab report. Vitamin D  level 14.8. Alkaline phosphatase 150.       Assessment & Plan:  1. Well woman exam (Primary) Continue healthy dietary habits.  Encouraged regular activity.  2. Essential hypertension Continue current regimen  3. Encounter for screening mammogram for malignant neoplasm of breast  - MM 3D SCREENING MAMMOGRAM BILATERAL BREAST  4. Vitamin D  deficiency Start vitamin D  as directed. Once completed, start OTC 1000-2000 units per day.  - Vitamin D , Ergocalciferol , (DRISDOL ) 1.25 MG (50000 UNIT) CAPS capsule; Take 1 capsule (50,000 Units total) by mouth every 7 (seven) days.  Dispense: 12 capsule; Refill: 1  Return in about 6 months (around 08/31/2024).

## 2024-03-22 ENCOUNTER — Other Ambulatory Visit (HOSPITAL_COMMUNITY): Payer: Self-pay

## 2024-03-23 ENCOUNTER — Other Ambulatory Visit (HOSPITAL_COMMUNITY): Payer: Self-pay

## 2024-03-25 ENCOUNTER — Other Ambulatory Visit: Payer: Self-pay

## 2024-03-25 ENCOUNTER — Other Ambulatory Visit (HOSPITAL_BASED_OUTPATIENT_CLINIC_OR_DEPARTMENT_OTHER): Payer: Self-pay

## 2024-03-30 ENCOUNTER — Encounter (HOSPITAL_COMMUNITY): Payer: Self-pay

## 2024-03-30 ENCOUNTER — Ambulatory Visit (HOSPITAL_COMMUNITY)
Admission: RE | Admit: 2024-03-30 | Discharge: 2024-03-30 | Disposition: A | Discharge status: Home / Self Care | Source: Ambulatory Visit | Attending: Nurse Practitioner | Admitting: Nurse Practitioner

## 2024-03-30 DIAGNOSIS — Z1231 Encounter for screening mammogram for malignant neoplasm of breast: Secondary | ICD-10-CM | POA: Insufficient documentation

## 2024-04-03 ENCOUNTER — Other Ambulatory Visit (HOSPITAL_BASED_OUTPATIENT_CLINIC_OR_DEPARTMENT_OTHER): Payer: Self-pay

## 2024-04-03 MED ORDER — CIPROFLOXACIN HCL 500 MG PO TABS
500.0000 mg | ORAL_TABLET | Freq: Two times a day (BID) | ORAL | 0 refills | Status: AC
  Filled 2024-04-03: qty 14, 7d supply, fill #0

## 2024-04-03 MED ORDER — FLUCONAZOLE 150 MG PO TABS
150.0000 mg | ORAL_TABLET | Freq: Once | ORAL | 0 refills | Status: AC
  Filled 2024-04-03: qty 2, 2d supply, fill #0

## 2024-04-22 ENCOUNTER — Other Ambulatory Visit (HOSPITAL_BASED_OUTPATIENT_CLINIC_OR_DEPARTMENT_OTHER): Payer: Self-pay

## 2024-08-31 ENCOUNTER — Ambulatory Visit: Admitting: Nurse Practitioner
